# Patient Record
Sex: Female | Born: 1935 | Race: White | Hispanic: No | Smoking: Never smoker
Health system: Southern US, Community
[De-identification: ages and names within clinical notes are randomized; demographics above are authoritative.]

## PROBLEM LIST (undated history)

## (undated) DIAGNOSIS — C50919 Malignant neoplasm of unspecified site of unspecified female breast: Secondary | ICD-10-CM

## (undated) DIAGNOSIS — F0390 Unspecified dementia without behavioral disturbance: Secondary | ICD-10-CM

## (undated) DIAGNOSIS — J449 Chronic obstructive pulmonary disease, unspecified: Secondary | ICD-10-CM

## (undated) DIAGNOSIS — G20 Parkinson's disease: Secondary | ICD-10-CM

## (undated) DIAGNOSIS — I10 Essential (primary) hypertension: Secondary | ICD-10-CM

## (undated) DIAGNOSIS — G20A1 Parkinson's disease without dyskinesia, without mention of fluctuations: Secondary | ICD-10-CM

## (undated) DIAGNOSIS — I872 Venous insufficiency (chronic) (peripheral): Secondary | ICD-10-CM

## (undated) DIAGNOSIS — G4733 Obstructive sleep apnea (adult) (pediatric): Secondary | ICD-10-CM

## (undated) DIAGNOSIS — I509 Heart failure, unspecified: Secondary | ICD-10-CM

## (undated) HISTORY — PX: HIP FRACTURE SURGERY: SHX118

## (undated) HISTORY — PX: BREAST SURGERY: SHX581

---

## 2004-09-01 ENCOUNTER — Ambulatory Visit: Payer: Self-pay | Admitting: Oncology

## 2004-11-19 ENCOUNTER — Ambulatory Visit: Payer: Self-pay | Admitting: Oncology

## 2004-12-02 ENCOUNTER — Ambulatory Visit: Payer: Self-pay | Admitting: Oncology

## 2005-02-18 ENCOUNTER — Ambulatory Visit: Payer: Self-pay | Admitting: Oncology

## 2005-02-20 ENCOUNTER — Ambulatory Visit: Payer: Self-pay | Admitting: Oncology

## 2005-03-02 ENCOUNTER — Ambulatory Visit: Payer: Self-pay | Admitting: Oncology

## 2005-05-20 ENCOUNTER — Ambulatory Visit: Payer: Self-pay | Admitting: Oncology

## 2005-06-01 ENCOUNTER — Ambulatory Visit: Payer: Self-pay | Admitting: Oncology

## 2005-08-19 ENCOUNTER — Ambulatory Visit: Payer: Self-pay | Admitting: Oncology

## 2005-09-01 ENCOUNTER — Ambulatory Visit: Payer: Self-pay | Admitting: Oncology

## 2005-10-18 ENCOUNTER — Ambulatory Visit: Payer: Self-pay | Admitting: Oncology

## 2005-10-23 ENCOUNTER — Ambulatory Visit: Payer: Self-pay | Admitting: Oncology

## 2005-11-01 ENCOUNTER — Ambulatory Visit: Payer: Self-pay | Admitting: Oncology

## 2006-01-22 ENCOUNTER — Ambulatory Visit: Payer: Self-pay | Admitting: Oncology

## 2006-01-30 ENCOUNTER — Ambulatory Visit: Payer: Self-pay | Admitting: Oncology

## 2006-03-02 ENCOUNTER — Ambulatory Visit: Payer: Self-pay | Admitting: Oncology

## 2006-05-12 ENCOUNTER — Ambulatory Visit: Payer: Self-pay | Admitting: Oncology

## 2006-06-01 ENCOUNTER — Ambulatory Visit: Payer: Self-pay | Admitting: Oncology

## 2006-09-08 ENCOUNTER — Ambulatory Visit: Payer: Self-pay | Admitting: Oncology

## 2006-09-12 ENCOUNTER — Ambulatory Visit: Payer: Self-pay | Admitting: Oncology

## 2006-10-02 ENCOUNTER — Ambulatory Visit: Payer: Self-pay | Admitting: Oncology

## 2007-02-26 ENCOUNTER — Ambulatory Visit: Payer: Self-pay | Admitting: Oncology

## 2007-03-02 ENCOUNTER — Ambulatory Visit: Payer: Self-pay | Admitting: Oncology

## 2007-03-03 ENCOUNTER — Ambulatory Visit: Payer: Self-pay | Admitting: Oncology

## 2007-06-12 ENCOUNTER — Ambulatory Visit: Payer: Self-pay | Admitting: Oncology

## 2007-07-03 ENCOUNTER — Ambulatory Visit: Payer: Self-pay | Admitting: Internal Medicine

## 2007-07-03 ENCOUNTER — Ambulatory Visit: Payer: Self-pay | Admitting: Oncology

## 2007-08-03 ENCOUNTER — Ambulatory Visit: Payer: Self-pay | Admitting: Internal Medicine

## 2007-12-03 ENCOUNTER — Ambulatory Visit: Payer: Self-pay | Admitting: Oncology

## 2007-12-11 ENCOUNTER — Ambulatory Visit: Payer: Self-pay | Admitting: Oncology

## 2008-01-03 ENCOUNTER — Ambulatory Visit: Payer: Self-pay | Admitting: Oncology

## 2008-02-15 ENCOUNTER — Ambulatory Visit: Payer: Self-pay | Admitting: Oncology

## 2008-02-16 ENCOUNTER — Ambulatory Visit: Payer: Self-pay | Admitting: Oncology

## 2008-03-02 ENCOUNTER — Ambulatory Visit: Payer: Self-pay | Admitting: Oncology

## 2008-04-01 ENCOUNTER — Ambulatory Visit: Payer: Self-pay | Admitting: Oncology

## 2008-08-29 ENCOUNTER — Ambulatory Visit: Payer: Self-pay | Admitting: Oncology

## 2008-08-31 ENCOUNTER — Ambulatory Visit: Payer: Self-pay | Admitting: Oncology

## 2008-09-01 ENCOUNTER — Ambulatory Visit: Payer: Self-pay | Admitting: Oncology

## 2009-01-30 ENCOUNTER — Ambulatory Visit: Payer: Self-pay | Admitting: Oncology

## 2009-02-16 ENCOUNTER — Ambulatory Visit: Payer: Self-pay | Admitting: Oncology

## 2009-02-22 ENCOUNTER — Ambulatory Visit: Payer: Self-pay | Admitting: Oncology

## 2009-03-02 ENCOUNTER — Ambulatory Visit: Payer: Self-pay | Admitting: Oncology

## 2009-04-01 ENCOUNTER — Ambulatory Visit: Payer: Self-pay | Admitting: Oncology

## 2009-08-02 ENCOUNTER — Ambulatory Visit: Payer: Self-pay | Admitting: Oncology

## 2009-08-18 ENCOUNTER — Ambulatory Visit: Payer: Self-pay | Admitting: Oncology

## 2009-09-01 ENCOUNTER — Ambulatory Visit: Payer: Self-pay | Admitting: Oncology

## 2010-01-30 ENCOUNTER — Ambulatory Visit: Payer: Self-pay | Admitting: Oncology

## 2010-02-19 ENCOUNTER — Ambulatory Visit: Payer: Self-pay | Admitting: Oncology

## 2010-02-21 ENCOUNTER — Ambulatory Visit: Payer: Self-pay | Admitting: Oncology

## 2010-03-02 ENCOUNTER — Ambulatory Visit: Payer: Self-pay | Admitting: Oncology

## 2010-09-01 ENCOUNTER — Ambulatory Visit: Payer: Self-pay | Admitting: Oncology

## 2010-09-11 ENCOUNTER — Ambulatory Visit: Payer: Self-pay | Admitting: Oncology

## 2010-10-02 ENCOUNTER — Ambulatory Visit: Payer: Self-pay | Admitting: Oncology

## 2011-02-25 ENCOUNTER — Ambulatory Visit: Payer: Self-pay | Admitting: Oncology

## 2011-03-12 ENCOUNTER — Ambulatory Visit: Payer: Self-pay | Admitting: Oncology

## 2011-04-02 ENCOUNTER — Ambulatory Visit: Payer: Self-pay | Admitting: Oncology

## 2011-09-09 ENCOUNTER — Ambulatory Visit: Payer: Self-pay | Admitting: Oncology

## 2011-09-09 ENCOUNTER — Ambulatory Visit: Payer: Self-pay | Admitting: Physical Medicine and Rehabilitation

## 2011-09-10 LAB — CANCER ANTIGEN 27.29: CA 27.29: 30.2 U/mL (ref 0.0–38.6)

## 2011-10-03 ENCOUNTER — Ambulatory Visit: Payer: Self-pay | Admitting: Physical Medicine and Rehabilitation

## 2012-02-26 ENCOUNTER — Ambulatory Visit: Payer: Self-pay | Admitting: Oncology

## 2012-02-26 LAB — CBC CANCER CENTER
Eosinophil #: 0.1 x10 3/mm (ref 0.0–0.7)
Eosinophil %: 1.7 %
HGB: 14.4 g/dL (ref 12.0–16.0)
Lymphocyte #: 1.5 x10 3/mm (ref 1.0–3.6)
MCHC: 33.9 g/dL (ref 32.0–36.0)
MCV: 88 fL (ref 80–100)
Neutrophil #: 5 x10 3/mm (ref 1.4–6.5)
Neutrophil %: 70.4 %
Platelet: 168 x10 3/mm (ref 150–440)
RBC: 4.84 10*6/uL (ref 3.80–5.20)
RDW: 14 % (ref 11.5–14.5)

## 2012-02-26 LAB — COMPREHENSIVE METABOLIC PANEL
Anion Gap: 9 (ref 7–16)
BUN: 20 mg/dL — ABNORMAL HIGH (ref 7–18)
Bilirubin,Total: 0.3 mg/dL (ref 0.2–1.0)
Co2: 30 mmol/L (ref 21–32)
Creatinine: 1.1 mg/dL (ref 0.60–1.30)
EGFR (African American): >60
EGFR (Non-African Amer.): 52 — ABNORMAL LOW
Glucose: 138 mg/dL — ABNORMAL HIGH (ref 65–99)
Osmolality: 294 (ref 275–301)
SGOT(AST): 19 U/L (ref 15–37)
SGPT (ALT): 27 U/L
Sodium: 145 mmol/L (ref 136–145)

## 2012-03-02 ENCOUNTER — Ambulatory Visit: Payer: Self-pay | Admitting: Oncology

## 2012-08-31 ENCOUNTER — Ambulatory Visit: Payer: Self-pay | Admitting: Oncology

## 2012-08-31 LAB — CBC CANCER CENTER
Basophil #: 0 x10 3/mm (ref 0.0–0.1)
Basophil %: 0.8 %
Eosinophil #: 0.2 x10 3/mm (ref 0.0–0.7)
HGB: 14.8 g/dL (ref 12.0–16.0)
Lymphocyte %: 25.7 %
MCHC: 32.9 g/dL (ref 32.0–36.0)
Monocyte %: 8.5 %
Neutrophil #: 3.6 x10 3/mm (ref 1.4–6.5)
Neutrophil %: 61.8 %
Platelet: 169 x10 3/mm (ref 150–440)
RDW: 13.8 % (ref 11.5–14.5)
WBC: 5.8 x10 3/mm (ref 3.6–11.0)

## 2012-08-31 LAB — COMPREHENSIVE METABOLIC PANEL
Albumin: 3.9 g/dL (ref 3.4–5.0)
Alkaline Phosphatase: 93 U/L (ref 50–136)
Calcium, Total: 9.6 mg/dL (ref 8.5–10.1)
Chloride: 105 mmol/L (ref 98–107)
Co2: 28 mmol/L (ref 21–32)
EGFR (Non-African Amer.): >60
Potassium: 3.7 mmol/L (ref 3.5–5.1)
SGOT(AST): 19 U/L (ref 15–37)
SGPT (ALT): 27 U/L (ref 12–78)
Sodium: 145 mmol/L (ref 136–145)

## 2012-09-01 ENCOUNTER — Ambulatory Visit: Payer: Self-pay | Admitting: Oncology

## 2012-09-01 LAB — CANCER ANTIGEN 27.29: CA 27.29: 44.5 U/mL — ABNORMAL HIGH (ref 0.0–38.6)

## 2013-03-02 ENCOUNTER — Ambulatory Visit: Payer: Self-pay | Admitting: Oncology

## 2014-10-10 ENCOUNTER — Emergency Department: Payer: Self-pay | Admitting: Emergency Medicine

## 2014-10-10 LAB — COMPREHENSIVE METABOLIC PANEL
ALBUMIN: 3.8 g/dL (ref 3.4–5.0)
Alkaline Phosphatase: 99 U/L
Anion Gap: 6 — ABNORMAL LOW (ref 7–16)
BILIRUBIN TOTAL: 0.5 mg/dL (ref 0.2–1.0)
BUN: 14 mg/dL (ref 7–18)
CALCIUM: 9.6 mg/dL (ref 8.5–10.1)
Chloride: 104 mmol/L (ref 98–107)
Co2: 29 mmol/L (ref 21–32)
Creatinine: 1 mg/dL (ref 0.60–1.30)
GLUCOSE: 125 mg/dL — AB (ref 65–99)
Osmolality: 279 (ref 275–301)
Potassium: 4.2 mmol/L (ref 3.5–5.1)
SGOT(AST): 22 U/L (ref 15–37)
SGPT (ALT): 34 U/L
SODIUM: 139 mmol/L (ref 136–145)
Total Protein: 7.7 g/dL (ref 6.4–8.2)

## 2014-10-10 LAB — URINALYSIS, COMPLETE
Bilirubin,UR: NEGATIVE
Blood: NEGATIVE
Glucose,UR: NEGATIVE mg/dL (ref 0–75)
Ketone: NEGATIVE
NITRITE: NEGATIVE
Ph: 8 (ref 4.5–8.0)
Protein: NEGATIVE
RBC,UR: 16 /HPF (ref 0–5)
SPECIFIC GRAVITY: 1.015 (ref 1.003–1.030)
SQUAMOUS EPITHELIAL: NONE SEEN
WBC UR: 7 /HPF (ref 0–5)

## 2014-10-10 LAB — CBC
HCT: 43.8 % (ref 35.0–47.0)
HGB: 14.3 g/dL (ref 12.0–16.0)
MCH: 29.2 pg (ref 26.0–34.0)
MCHC: 32.7 g/dL (ref 32.0–36.0)
MCV: 89 fL (ref 80–100)
Platelet: 168 10*3/uL (ref 150–440)
RBC: 4.91 10*6/uL (ref 3.80–5.20)
RDW: 13.8 % (ref 11.5–14.5)
WBC: 14.8 10*3/uL — AB (ref 3.6–11.0)

## 2014-10-10 LAB — TROPONIN I: Troponin-I: <0.02 ng/mL

## 2014-10-22 ENCOUNTER — Emergency Department: Payer: Self-pay | Admitting: Emergency Medicine

## 2014-12-09 ENCOUNTER — Emergency Department: Payer: Self-pay | Admitting: Emergency Medicine

## 2015-03-21 ENCOUNTER — Inpatient Hospital Stay
Admit: 2015-03-21 | Disposition: A | Discharge status: Other Acute Inpatient Hospital | Payer: Self-pay | Attending: Internal Medicine | Admitting: Internal Medicine

## 2015-03-21 DIAGNOSIS — Z01818 Encounter for other preprocedural examination: Secondary | ICD-10-CM

## 2015-03-21 LAB — CBC WITH DIFFERENTIAL/PLATELET
BASOS ABS: 0 10*3/uL (ref 0.0–0.1)
BASOS PCT: 0.2 %
Basophil #: 0 10*3/uL (ref 0.0–0.1)
Basophil %: 0.3 %
EOS PCT: 0 %
Eosinophil #: 0 10*3/uL (ref 0.0–0.7)
Eosinophil #: 0 10*3/uL (ref 0.0–0.7)
Eosinophil %: 0.3 %
HCT: 43.5 % (ref 35.0–47.0)
HCT: 43.1 % (ref 35.0–47.0)
HGB: 14.3 g/dL (ref 12.0–16.0)
HGB: 14.1 g/dL (ref 12.0–16.0)
Lymphocyte #: 1.3 10*3/uL (ref 1.0–3.6)
Lymphocyte #: 1 10*3/uL (ref 1.0–3.6)
Lymphocyte %: 9.6 %
Lymphocyte %: 6.9 %
MCH: 28.8 pg (ref 26.0–34.0)
MCH: 28.4 pg (ref 26.0–34.0)
MCHC: 32.9 g/dL (ref 32.0–36.0)
MCHC: 32.6 g/dL (ref 32.0–36.0)
MCV: 88 fL (ref 80–100)
MCV: 87 fL (ref 80–100)
MONOS PCT: 5.2 %
MONOS PCT: 6.4 %
Monocyte #: 0.7 x10 3/mm (ref 0.2–0.9)
Monocyte #: 0.9 x10 3/mm (ref 0.2–0.9)
Neutrophil #: 11.1 10*3/uL — ABNORMAL HIGH (ref 1.4–6.5)
Neutrophil #: 12.5 10*3/uL — ABNORMAL HIGH (ref 1.4–6.5)
Neutrophil %: 84.6 %
Neutrophil %: 86.5 %
PLATELETS: 177 10*3/uL (ref 150–440)
Platelet: 167 10*3/uL (ref 150–440)
RBC: 4.96 10*6/uL (ref 3.80–5.20)
RBC: 4.95 10*6/uL (ref 3.80–5.20)
RDW: 14.1 % (ref 11.5–14.5)
RDW: 14 % (ref 11.5–14.5)
WBC: 13.1 10*3/uL — ABNORMAL HIGH (ref 3.6–11.0)
WBC: 14.4 10*3/uL — AB (ref 3.6–11.0)

## 2015-03-21 LAB — COMPREHENSIVE METABOLIC PANEL
ANION GAP: 7 (ref 7–16)
Albumin: 4.2 g/dL
Alkaline Phosphatase: 97 U/L
BILIRUBIN TOTAL: 0.5 mg/dL
BUN: 22 mg/dL — AB
CO2: 26 mmol/L
Calcium, Total: 10 mg/dL
Chloride: 107 mmol/L
Creatinine: 0.94 mg/dL
EGFR (African American): >60
GFR CALC NON AF AMER: 59 — AB
GLUCOSE: 146 mg/dL — AB
Potassium: 3.8 mmol/L
SGOT(AST): 40 U/L
SGPT (ALT): 34 U/L
Sodium: 140 mmol/L
Total Protein: 7.5 g/dL

## 2015-03-21 LAB — URINALYSIS, COMPLETE
BLOOD: NEGATIVE
Bacteria: NONE SEEN
Bilirubin,UR: NEGATIVE
GLUCOSE, UR: NEGATIVE mg/dL (ref 0–75)
Ketone: NEGATIVE
Nitrite: NEGATIVE
PH: 5 (ref 4.5–8.0)
Specific Gravity: 1.026 (ref 1.003–1.030)

## 2015-03-21 LAB — BASIC METABOLIC PANEL
Anion Gap: 8 (ref 7–16)
BUN: 21 mg/dL — AB
CHLORIDE: 105 mmol/L
Calcium, Total: 9.7 mg/dL
Co2: 27 mmol/L
Creatinine: 0.83 mg/dL
EGFR (African American): >60
GLUCOSE: 145 mg/dL — AB
Potassium: 3.7 mmol/L
SODIUM: 140 mmol/L

## 2015-03-21 LAB — TROPONIN I: Troponin-I: <0.03 ng/mL

## 2015-03-21 LAB — CK TOTAL AND CKMB (NOT AT ARMC)
CK, Total: 135 U/L
CK-MB: 4 ng/mL

## 2015-03-21 LAB — APTT: Activated PTT: 27.3 secs (ref 23.6–35.9)

## 2015-03-21 LAB — PROTIME-INR
INR: 1
PROTHROMBIN TIME: 13.6 s

## 2015-03-22 LAB — BASIC METABOLIC PANEL
Anion Gap: 7 (ref 7–16)
BUN: 13 mg/dL
Calcium, Total: 9.3 mg/dL
Chloride: 105 mmol/L
Co2: 29 mmol/L
Creatinine: 0.72 mg/dL
EGFR (Non-African Amer.): >60
GLUCOSE: 134 mg/dL — AB
Potassium: 3.6 mmol/L
Sodium: 141 mmol/L

## 2015-03-22 LAB — CBC WITH DIFFERENTIAL/PLATELET
BASOS ABS: 0 10*3/uL (ref 0.0–0.1)
Basophil %: 0.3 %
Eosinophil #: 0.1 10*3/uL (ref 0.0–0.7)
Eosinophil %: 1.1 %
HCT: 38.9 % (ref 35.0–47.0)
HGB: 13.1 g/dL (ref 12.0–16.0)
LYMPHS PCT: 15.3 %
Lymphocyte #: 1.3 10*3/uL (ref 1.0–3.6)
MCH: 29.3 pg (ref 26.0–34.0)
MCHC: 33.6 g/dL (ref 32.0–36.0)
MCV: 87 fL (ref 80–100)
Monocyte #: 0.7 x10 3/mm (ref 0.2–0.9)
Monocyte %: 8.6 %
NEUTROS PCT: 74.7 %
Neutrophil #: 6.2 10*3/uL (ref 1.4–6.5)
Platelet: 129 10*3/uL — ABNORMAL LOW (ref 150–440)
RBC: 4.46 10*6/uL (ref 3.80–5.20)
RDW: 13.9 % (ref 11.5–14.5)
WBC: 8.2 10*3/uL (ref 3.6–11.0)

## 2015-03-23 LAB — PLATELET COUNT: PLATELETS: 137 10*3/uL — AB (ref 150–440)

## 2015-03-24 LAB — BASIC METABOLIC PANEL
ANION GAP: 9 (ref 7–16)
BUN: 23 mg/dL — ABNORMAL HIGH
Calcium, Total: 9.5 mg/dL
Chloride: 106 mmol/L
Co2: 29 mmol/L
Creatinine: 0.65 mg/dL
EGFR (Non-African Amer.): >60
Glucose: 153 mg/dL — ABNORMAL HIGH
POTASSIUM: 3.4 mmol/L — AB
Sodium: 144 mmol/L

## 2015-03-24 LAB — CBC WITH DIFFERENTIAL/PLATELET
Basophil #: 0 10*3/uL (ref 0.0–0.1)
Basophil %: 0.5 %
Eosinophil #: 0.1 10*3/uL (ref 0.0–0.7)
Eosinophil %: 1.8 %
HCT: 40.1 % (ref 35.0–47.0)
HGB: 13.5 g/dL (ref 12.0–16.0)
Lymphocyte #: 1.4 10*3/uL (ref 1.0–3.6)
Lymphocyte %: 17.2 %
MCH: 29.2 pg (ref 26.0–34.0)
MCHC: 33.6 g/dL (ref 32.0–36.0)
MCV: 87 fL (ref 80–100)
MONOS PCT: 7.3 %
Monocyte #: 0.6 x10 3/mm (ref 0.2–0.9)
NEUTROS ABS: 6.1 10*3/uL (ref 1.4–6.5)
Neutrophil %: 73.2 %
Platelet: 154 10*3/uL (ref 150–440)
RBC: 4.6 10*6/uL (ref 3.80–5.20)
RDW: 14.5 % (ref 11.5–14.5)
WBC: 8.3 10*3/uL (ref 3.6–11.0)

## 2015-04-02 NOTE — Op Note (Signed)
PATIENT NAME:  Elizabeth Farmer, Elizabeth Farmer MR#:  [id number] DATE OF BIRTH:  [birth year]  DATE OF PROCEDURE:  03/21/2015  PREOPERATIVE DIAGNOSIS: Right intertrochanteric hip fracture.   POSTOPERATIVE DIAGNOSIS:  Right intertrochanteric hip fracture.   PROCEDURE:  Intramedullary fixation for intertrochanteric hip fracture.   ANESTHESIA: Spinal.   SURGEON: [place], M.D.   ESTIMATED BLOOD LOSS: 50 mL.   COMPLICATIONS: None.   IMPLANTS:  Biomet Affixus 11 x 180 mm short intramedullary rod, a 90 x 10 mm lag screw, and a 36 mm distal interlocking screw.   INDICATION FOR THE PROCEDURE: The patient is a 79 year old female who sustained a fall prior to her admission. She was brought to the [facility] Emergency Room where she was diagnosed with a minimally displaced intertrochanteric hip fracture of the right hip.  Given the patient is ambulatory at baseline, I have recommended surgical fixation for this fracture using an intramedullary rod. I reviewed the risks and benefits of surgery with the patient and her family who is at her bedside in the preoperative area prior to surgery. I marked the right hip with the word "yes" according to the hospital's right side protocol. I informed the patient and family of the risks and benefits of surgery which included infection, bleeding, nerve or blood vessel injury, the need for a blood transfusion, malunion, nonunion, leg length discrepancy, change in lower extremity rotation, persistent pain or weakness, and the need for further surgery including conversion to a total hip arthroplasty. Medical risks include, but are not limited to, DVT, pulmonary embolism, myocardial infarction, stroke, pneumonia, respiratory failure, and death. They understood these risks and wished to proceed with surgery. The patient was then cleared by the hospitalist service for surgery.   PROCEDURE NOTE:  The patient was brought to the operating room where she was placed supine on the  operative table after undergoing a spinal anesthetic by the anesthesia service. All bony prominences were adequately padded. The patient's right hip was placed in a leg holder with the hip in extension. The left leg was placed in a hemilithotomy position. The C-arm images in the AP and lateral planes were taken. A closed reduction of the fracture was performed. Once adequate reduction was achieved, the patient was prepped and draped in a sterile fashion. Timeout was performed to verify the patient's name, date of birth, medical record number, correct site of surgery, and correct procedure to be performed. It was also used to verify that patient received antibiotics and appropriate instruments, implants, and radiographic studies were available in the room. Once all in attendance were in agreement, the case began.   The patient had C-arm images taken in AP and lateral planes to allow for incisional planning. The first incision was made superior to the tip of the greater trochanter in line with the femur. The deep fascia was then incised literally with a deep #10 blade.  The tip of the greater trochanter was then palpated after the abductor muscle was split in line with its fibers. This allowed for placement of a drill pin at the tip of the greater trochanter. This was advanced into the proximal femur to the level of the lesser trochanter. The position of the drill pin was confirmed on AP and lateral C-arm imaging. This was then overdrilled using a cannulated drill. A soft tissue protector was used to protect the abductor muscle and tendon. An 11 x 180 mm Biomet Affixus short nail was then inserted into the femur through this hole in the  tip of the greater trochanter. This was then manually placed into position. The position of the intramedullary rod was confirmed on AP and lateral C-arm images. Through the Biomet Affixus guide arm, a drill guide was then placed for the drill pin of the lag screw. A small stab  incision was made to allow for placement of this drill guide along the lateral cortex of the femur.  The drill pin was then advanced across the fracture site and into the femoral head. Its position was confirmed on the AP and lateral C-arm images. A depth gauge was used to measure the depth which was determined to be 95 mm in length. The drill pin was then overdrilled with the cannulated drill for the lag screw. A 95 mm lag screw was then advanced into position by hand into the subcortical bone of the femoral head.  A tip to apex distance of less than 25 mm was achieved. The compression was applied across the fracture site as the traction was relieved. The set screw was then tightened through the top of the Biomet Affixus nail. The guide sleeve for the lag screw was then removed from the Affixus guide arm. The distal interlocking guide screw was then placed through the guide arm and another small stab incision was made along the lateral femur to allow for placement of the drill guide along the lateral cortex of the femur. The drill for the distal interlocking screw was then advanced bicortically. The interlocking screw was then measured at 36 mm.  A 36 mm distal interlocking screw was then advanced into position by hand. The guide arm for the Affixus nail was then removed. Final C-arm images of the construct were taken. The wounds were copiously irrigated. The deep fascia in the proximal incision was closed with an interrupted #1 Vicryl and the subcutaneous tissues of all 3 incisions were closed with 2-0 Vicryl. Staples were used to approximate the skin along with a dry sterile dressing. The patient was then transferred to a hospital bed and brought to the PACU in stable condition and I spoke with her family in the postop consultation room to let them know the case had gone without complication. The patient was stable in the recovery room.   ____________________________ Timoteo Gaul,  MD klk:sp D: 03/21/2015 15:39:23 ET T: 03/21/2015 15:52:00 ET JOB#: [id number]  cc: Timoteo Gaul, MD, <Dictator> Timoteo Gaul MD ELECTRONICALLY SIGNED 03/29/2015 13:22

## 2015-04-02 NOTE — H&P (Signed)
PATIENT NAME:  Elizabeth Farmer, Elizabeth Farmer MR#:  [id number] DATE OF BIRTH:  Jun 07, 1936  DATE OF ADMISSION:  03/21/2015  CHIEF COMPLAINT:  Fall.   HISTORY OF PRESENT ILLNESS:  This is a 79 year old female who was in a skilled nursing facility. She has Alzheimer's, COPD, CHF, hypertension, history of breast cancer. She was walking with her walker and stated that her legs felt weak and she had a mechanical fall with a subsequent right hip fracture. She was brought to the ED for evaluation. Orthopedics has seen her and requested that she be admitted to the hospitalist service for possible intervention on her right hip fracture in the morning, so the hospitalists were called for admission.   Due to the patient's Alzheimer dementia, much of the history is given per her sister, who is present in the room.   PRIMARY CARE PHYSICIAN:  Constance Goltz, MD   PAST MEDICAL HISTORY:  Hypertension, COPD, CHF, Alzheimer's, breast cancer as well as a prior right arm fracture in November 2015.   CURRENT MEDICATIONS:  Acetaminophen 500 mg q. 6 hours p.r.n., Advair 250/50 Diskus 1 puff by mouth b.i.d., calcium carbonate 640 mg 1 tab p.o. every morning, diphenhydramine 25 mg 1 tab p.o. daily as needed for food allergy, Lasix 20 mg daily, letrozole 2.5 mg daily, losartan 100 mg daily, metoprolol succinate extended-release 50 mg daily, potassium chloride 10 mEq 2 caps twice a day, Senna-Lax 8.6 mg 1 tablet by mouth at bedtime, Spiriva 18 mcg capsule 1 capsule daily inhalation, vitamin D3 at 1000 units daily, Ventolin 90 mcg inhaler 2 puffs by mouth q. 4 hours p.r.n. for wheezing.   PAST SURGICAL HISTORY:  Bladder sling and 2 breast surgeries for her breast cancer.   ALLERGIES:  ASPIRIN, SULFA, YELLOW DYE #5, FISH, SALICYLATES.   FAMILY HISTORY:  CAD, diabetes mellitus, cancer.   SOCIAL HISTORY:  Never a smoker. Denies alcohol or illicit drug use. She was exposed to secondhand smoke for many years from her husband.   REVIEW OF  SYSTEMS: CONSTITUTIONAL:  Denies fever, fatigue. Endorses some mild generalized weakness, which may have led to the mechanical fall.  EYES:  No blurred or double vision, pain, or redness.  EAR, NOSE, AND THROAT:  No ear pain, hearing loss, or difficulty swallowing.  RESPIRATORY:  No cough, dyspnea, or painful respiration.  CARDIOVASCULAR:  No chest pain, edema, or palpitations.  GASTROINTESTINAL:  No nausea, vomiting, diarrhea, abdominal pain, or constipation.  GENITOURINARY:  No dysuria, hematuria, or frequency.  ENDOCRINE:  No nocturia, thyroid problems, or heat or cold intolerance.  HEMATOLOGIC AND LYMPHATIC:  No easy bruising or bleeding or swollen glands.  INTEGUMENTARY:  No acne, rash, or lesion.  MUSCULOSKELETAL:  No acute arthritis, joint swelling, or gout.  NEUROLOGICAL:  No numbness. No focal weakness. No headache.  PSYCHIATRIC:  No anxiety, insomnia, or depression.   PHYSICAL EXAMINATION: VITAL SIGNS:  Blood pressure 171/101, pulse 95, temperature 97.6, respirations 20 with 95% O2 saturations on room air.  GENERAL:  This is an elderly female supine in bed in no acute distress.  HEENT:  Pupils are equal, round, and reactive to light. Extraocular movements are intact. No scleral icterus. Moist mucosal membranes. She does have a little bit of left-sided facial swelling, where she may have hit her head as she fell as well, but no acute bruising and no fracture, hematoma, or hemorrhage anywhere. NECK:  Thyroid is not enlarged. Neck is supple. No masses. Nontender. No cervical adenopathy. No JVD.  RESPIRATORY:  Clear to auscultation bilaterally with no rales, rhonchi, or wheezing. No respiratory distress.  CARDIOVASCULAR:  Regular rate and rhythm. No murmurs, rubs, or gallops on exam. Good pedal pulses with no lower extremity edema.  ABDOMEN:  Soft, nontender, nondistended. Good bowel sounds.  MUSCULOSKELETAL:  She has 5/5 muscular strength in her upper extremities and her left lower  extremity. She is unable to move her right lower extremity without significant pain. She has full spontaneous range of motion throughout her 3 nonaffected extremities. No cyanosis or clubbing.  SKIN:  No rash or lesions. Skin is warm, dry, and intact.  LYMPHATIC:  No adenopathy.  NEUROLOGICAL:  Cranial nerves are intact. Sensation is intact throughout. No dysarthria or aphasia.  PSYCHIATRIC:  She is alert. She is oriented to person, but not to place, time, or circumstance. She is cooperative, confused, and not agitated.   LABORATORY DATA:  White count is 13.1, hemoglobin 14.3, hematocrit 43.5, and platelets are 177,000. Sodium is 140, potassium 3.8, chloride 107, bicarbonate 26, BUN 22, creatinine 0.94, calcium 10, glucose 146, total protein 7.5, albumin 4.2, total bilirubin 0.5, alkaline phosphatase 97, AST 40, and ALT is 34. Troponin is less than 0.03. UA is largely negative. She has trace leukocyte esterase and 6 to 3 RBCs with 0 to 5 WBCs, no bacteria seen, a largely negative UA.   RADIOLOGIC DATA:  Radiographic imaging results are not up yet, but preliminary x-ray showed the right hip fracture.   ASSESSMENT AND PLAN: 1.  Right hip fracture. This is after a mechanical fall. Orthopedics is aware of this patient and is being consulted. Hospitalists are admitting with orthopedics consult for potential repair of the fracture. The patient will be given good pain control and deep vein thrombosis prophylaxis as below.  2.  Cardiac risk stratification. There is a specific request by Orthopedics for cardiac risk stratification in this patient. According to the revised [facility] Cardiac Risk index, this is not one of the high-risk types of surgery. The patient only has one risk factor, which includes congestive heart failure. She has no history of cerebrovascular disease. No history of diabetes mellitus requiring insulin. Her creatinine today was less than 2. She has no history of ischemic heart disease. With  one risk factor, the risk of cardiac death, nonfatal myocardial infarction, and nonfatal cardiac arrest is 1% as related to the surgery. The risk of myocardial infarction, pulmonary edema, ventricular fibrillation, primary cardiac arrest, and complete heart block as related to surgery with one risk factor is 1.3%.  3.  Hypertension. This is a chronic problem. We will continue her home medications and add on some IV p.r.n. medications to keep her blood pressure controlled.  4.  Chronic obstructive pulmonary disease, a chronic stable problem. We will continue her home medications for this, specifically her home inhalers.  5.  Breast cancer. She is still being treated with letrozole for this. We will continue this medication while here.  6.  Deep vein thrombosis prophylaxis with subcutaneous heparin.   CODE STATUS:  As stated above, this patient is DO NOT RESUSCITATE.   TIME SPENT ON THIS ADMISSION:  50 minutes.   ____________________________ Wilford Corner. Jannifer Franklin, MD dfw:nb D: 03/21/2015 03:17:05 ET T: 03/21/2015 03:45:11 ET JOB#: [id number]  cc: Wilford Corner. Jannifer Franklin, MD, <Dictator> Satoria Dunlop Fawn Kirk MD ELECTRONICALLY SIGNED 03/21/2015 5:33

## 2015-04-02 NOTE — Discharge Summary (Signed)
PATIENT NAME:  Elizabeth Farmer, Elizabeth Farmer MR#:  [id number] DATE OF BIRTH:  01/02/1936  DATE OF ADMISSION:  03/21/2015 DATE OF DISCHARGE:  03/24/2015  PRIMARY CARE PHYSICIAN: Dr. Constance Goltz   FINAL DIAGNOSES: 1.  Right hip fracture, closed, requiring operative repair.  2.  Hypertension, essential.  3.  Alzheimer dementia without behavioral disturbance.  4.  Chronic obstructive pulmonary disease.  5.  History of breast cancer.   MEDICATIONS ON DISCHARGE: Include Advair Diskus 250/50 one puff twice a day, calcium carbonate 648 one tablet once a day in the morning, furosemide 20 mg in the morning, letrozole 2.5 mg at bedtime, losartan 100 mg in the morning, metoprolol extended release 50 mg at bedtime, potassium chloride 10 mEq 2 tablets twice a day, Spiriva 18 mcg 1 capsule daily, Ventolin HFA 2 puffs every 4 hours as needed for wheezing, shortness breath, acetaminophen 500 mg every 6 hours as needed for fever, Benadryl 25 mg once a day in the morning, senna lax 8.6 mg 1 tablet at bedtime, oxycodone 5 mg every 6 hours as needed for moderate pain, heparin 500 units subcutaneous injection every 12 hours for 14 days, ferrous sulfate 325 mg 1 tablet twice a day, calcium and vitamin D 500 mg/200 international units 1 tablet twice a day, Colace 100 mg twice a day as needed for stool softener.   DRESSING CARE: As per Dr. Mack Guise.   DIET: Low-sodium, regular consistency.   ACTIVITY: As tolerated.   FOLLOWUP: With physical therapy, Dr. Mack Guise orthopedics, 1 to 2 days with doctor at rehabilitation.   HOSPITAL COURSE: The patient was admitted April 19, discharged 03/24/2015. Came in with a fall, found to have a right hip fracture.   LABORATORY AND RADIOLOGICAL DATA DURING THE HOSPITAL COURSE: Included a troponin that was negative. Glucose 146, BUN 22, creatinine 0.94, sodium 140, potassium 3.8, chloride 107, CO2 of 26, calcium 10.0. Liver function tests normal range. White blood cell count 13.1, hemoglobin  and hematocrit 14.3 and 43.5, platelet count of 177,000. CT scan of the head: No acute intracranial process. Atrophy with chronic microvascular ischemic disease. Right hip fracture, showed intertrochanteric right femur fracture. Chest x-ray, no definite acute process identified. Urinalysis, trace leukocyte esterase, otherwise negative. PT, INR, and PTT normal range. EKG: Normal sinus rhythm, Q waves inferiorly. Right hip postoperative showed hardware fixation of the previously demonstrated nondisplaced right intertrochanteric fracture with normal alignment. Upon discharge, white count 8.3, hemoglobin 13.5, platelet count 154, glucose 153, BUN 23, creatinine 0.65, potassium 3.4.   HOSPITAL COURSE PER PROBLEM LIST:  1. For the patient's right hip fracture, operative repair as per Dr. Mack Guise. Please see operative note. The patient in the hospital 3 days postoperatively. Will go out to rehabilitation for further gait training.  2. Hypertension, essential. Very variable during the hospital course. Continue usual medications. I think pain and dementia may have something to do with it.  3. Alzheimer dementia without behavioral disturbance.  4. History of breast cancer, on letrozole.  5. Chronic obstructive pulmonary disease, respiratory status stable.   TIME SPENT ON DISCHARGE: 35 minutes    ____________________________ Tana Conch. Leslye Peer, MD rjw:at D: 03/24/2015 09:08:12 ET T: 03/24/2015 09:16:55 ET JOB#: [id number]  cc: Tana Conch. Leslye Peer, MD, <Dictator> Dr. Constance Goltz Marisue Brooklyn MD ELECTRONICALLY SIGNED 03/24/2015 15:43

## 2015-04-13 DIAGNOSIS — I10 Essential (primary) hypertension: Secondary | ICD-10-CM

## 2015-04-13 DIAGNOSIS — G301 Alzheimer's disease with late onset: Secondary | ICD-10-CM

## 2015-04-13 DIAGNOSIS — S7292XS Unspecified fracture of left femur, sequela: Secondary | ICD-10-CM

## 2015-04-13 DIAGNOSIS — C50919 Malignant neoplasm of unspecified site of unspecified female breast: Secondary | ICD-10-CM

## 2015-04-13 DIAGNOSIS — J449 Chronic obstructive pulmonary disease, unspecified: Secondary | ICD-10-CM

## 2015-04-13 DIAGNOSIS — I872 Venous insufficiency (chronic) (peripheral): Secondary | ICD-10-CM

## 2015-05-08 ENCOUNTER — Telehealth: Payer: Self-pay

## 2015-05-08 NOTE — Telephone Encounter (Signed)
PLEASE NOTE: All timestamps contained within this report are represented as [place] Standard Time. CONFIDENTIALTY NOTICE: This fax transmission is intended only for the addressee. It contains information that is legally privileged, confidential or otherwise protected from use or disclosure. If you are not the intended recipient, you are strictly prohibited from reviewing, disclosing, copying using or disseminating any of this information or taking any action in reliance on or regarding this information. If you have received this fax in error, please notify us immediately by telephone so that we can arrange for its return to [place]. Phone: [phone], Toll-Free: [phone], Fax: [phone] Page: 1 of 2 Call Id: [id number] [place] Patient Name: Elizabeth Farmer Gender: Female DOB: 07/31/1936 Age: 79 Y 2 M 3 D Return Phone Number: Address: City/State/Zip:  Client [place] Night - Client Client Site [facility] Physician Viviana Simpler Contact Type Call Call Type Triage / Clinical Caller Name Becky RN with [place] Relationship To Patient Other Return Phone Number Please choose phone number Chief Complaint Paging or Request for Consult Initial Comment Caller states she needs an order to hold aspirin until Monday. CB# [phone] Nurse Assessment Nurse: Justine Null, RN, Rodena Piety Date/Time Eilene Ghazi Time): 05/06/2015 1:27:23 PM Confirm and document reason for call. If symptomatic, describe symptoms. ---Caller states she needs an order to hold aspirin until Monday. CB# [phone] caller stated that she has an allergy to salicyates and aspirin and has been getting it daily for about a month and has ASA 325 mg enteric coated and was to start after the heparin was discontinued Has the patient traveled out of the country within the last  30 days? ---No Does the patient require triage? ---Declined Triage Please document clinical information provided and list any resource used. ---when triager reached the caller she stated that she has found an ASAallergy on the chart and they have been giving for the past month daily Guidelines Guideline Title Affirmed Question Affirmed Notes Nurse Date/Time ([place] Time) Disp. Time Eilene Ghazi Time) Disposition Final User 05/06/2015 1:31:44 PM Paged On Call back to [facility], RN, Rodena Piety 05/06/2015 1:38:11 PM Call Completed Justine Null, RN, Rodena Piety 05/06/2015 1:38:26 PM Clinical Call Yes Justine Null, RN, Rodena Piety After Care Instructions Given Call Event Type User Date / Time Description Comments User: Charmayne Sheer, RN Date/Time Eilene Ghazi Time): 05/06/2015 1:45:46 PM PLEASE NOTE: All timestamps contained within this report are represented as [place] Standard Time. CONFIDENTIALTY NOTICE: This fax transmission is intended only for the addressee. It contains information that is legally privileged, confidential or otherwise protected from use or disclosure. If you are not the intended recipient, you are strictly prohibited from reviewing, disclosing, copying using or disseminating any of this information or taking any action in reliance on or regarding this information. If you have received this fax in error, please notify us immediately by telephone so that we can arrange for its return to [place]. Phone: [phone], Toll-Free: [phone], Fax: [phone] Page: 2 of 2 Call Id: [id number] Comments call placed to the caller to notify her of the need to place the ASA on hold until Monday and to call the office on Monday to followup with the MD to see what he wants to do as her anticoagulation Verbalized understanding Paging DoctorName Phone DateTime Result/Outcome Message Type Notes Alysia Penna [phone] 05/06/2015 1:31:44 PM Paged On Call Back to Call Center Doctor Paged please call Rodena Piety at [facility]  at [phone] Thank you Alysia Penna 05/06/2015 1:37:52 PM Spoke with On Call - General Message Result recieved a return call form the on call MD and informed him of the patient status and the request to be able to hold the ASA until monday orders given to hold until MOnday but on MOnday to call the office to foolow up to see what her mD would like to use

## 2015-05-08 NOTE — Telephone Encounter (Signed)
I dealt with this at [facility]

## 2015-05-17 DIAGNOSIS — I872 Venous insufficiency (chronic) (peripheral): Secondary | ICD-10-CM | POA: Diagnosis not present

## 2015-05-17 DIAGNOSIS — J449 Chronic obstructive pulmonary disease, unspecified: Secondary | ICD-10-CM

## 2015-05-17 DIAGNOSIS — G309 Alzheimer's disease, unspecified: Secondary | ICD-10-CM

## 2015-05-17 DIAGNOSIS — I10 Essential (primary) hypertension: Secondary | ICD-10-CM | POA: Diagnosis not present

## 2015-06-06 DIAGNOSIS — G301 Alzheimer's disease with late onset: Secondary | ICD-10-CM

## 2015-06-06 DIAGNOSIS — I10 Essential (primary) hypertension: Secondary | ICD-10-CM

## 2015-06-06 DIAGNOSIS — J449 Chronic obstructive pulmonary disease, unspecified: Secondary | ICD-10-CM

## 2015-06-06 DIAGNOSIS — C50919 Malignant neoplasm of unspecified site of unspecified female breast: Secondary | ICD-10-CM

## 2015-06-06 DIAGNOSIS — J9611 Chronic respiratory failure with hypoxia: Secondary | ICD-10-CM

## 2015-08-13 ENCOUNTER — Emergency Department: Payer: Medicare Other

## 2015-08-13 ENCOUNTER — Encounter: Payer: Self-pay | Admitting: Emergency Medicine

## 2015-08-13 ENCOUNTER — Inpatient Hospital Stay
Admission: EM | Admit: 2015-08-13 | Discharge: 2015-08-15 | DRG: 177 | Disposition: A | Discharge status: Skilled Nursing Facility | Payer: Medicare Other | Attending: Internal Medicine | Admitting: Internal Medicine

## 2015-08-13 DIAGNOSIS — Z7401 Bed confinement status: Secondary | ICD-10-CM

## 2015-08-13 DIAGNOSIS — J9600 Acute respiratory failure, unspecified whether with hypoxia or hypercapnia: Secondary | ICD-10-CM | POA: Diagnosis present

## 2015-08-13 DIAGNOSIS — J9690 Respiratory failure, unspecified, unspecified whether with hypoxia or hypercapnia: Secondary | ICD-10-CM

## 2015-08-13 DIAGNOSIS — J690 Pneumonitis due to inhalation of food and vomit: Principal | ICD-10-CM | POA: Diagnosis present

## 2015-08-13 DIAGNOSIS — Z888 Allergy status to other drugs, medicaments and biological substances status: Secondary | ICD-10-CM

## 2015-08-13 DIAGNOSIS — Z66 Do not resuscitate: Secondary | ICD-10-CM | POA: Diagnosis present

## 2015-08-13 DIAGNOSIS — Z7951 Long term (current) use of inhaled steroids: Secondary | ICD-10-CM | POA: Diagnosis not present

## 2015-08-13 DIAGNOSIS — Z9889 Other specified postprocedural states: Secondary | ICD-10-CM

## 2015-08-13 DIAGNOSIS — I10 Essential (primary) hypertension: Secondary | ICD-10-CM | POA: Diagnosis present

## 2015-08-13 DIAGNOSIS — J189 Pneumonia, unspecified organism: Secondary | ICD-10-CM

## 2015-08-13 DIAGNOSIS — G4733 Obstructive sleep apnea (adult) (pediatric): Secondary | ICD-10-CM | POA: Diagnosis present

## 2015-08-13 DIAGNOSIS — I509 Heart failure, unspecified: Secondary | ICD-10-CM | POA: Diagnosis present

## 2015-08-13 DIAGNOSIS — Z79899 Other long term (current) drug therapy: Secondary | ICD-10-CM | POA: Diagnosis not present

## 2015-08-13 DIAGNOSIS — J441 Chronic obstructive pulmonary disease with (acute) exacerbation: Secondary | ICD-10-CM | POA: Diagnosis present

## 2015-08-13 DIAGNOSIS — F0390 Unspecified dementia without behavioral disturbance: Secondary | ICD-10-CM | POA: Diagnosis present

## 2015-08-13 DIAGNOSIS — E119 Type 2 diabetes mellitus without complications: Secondary | ICD-10-CM | POA: Diagnosis present

## 2015-08-13 DIAGNOSIS — Z853 Personal history of malignant neoplasm of breast: Secondary | ICD-10-CM

## 2015-08-13 DIAGNOSIS — Z993 Dependence on wheelchair: Secondary | ICD-10-CM | POA: Diagnosis not present

## 2015-08-13 DIAGNOSIS — G20 Parkinson's disease: Secondary | ICD-10-CM | POA: Diagnosis present

## 2015-08-13 DIAGNOSIS — J9601 Acute respiratory failure with hypoxia: Secondary | ICD-10-CM | POA: Diagnosis present

## 2015-08-13 DIAGNOSIS — Z91041 Radiographic dye allergy status: Secondary | ICD-10-CM | POA: Diagnosis not present

## 2015-08-13 DIAGNOSIS — R401 Stupor: Secondary | ICD-10-CM | POA: Diagnosis not present

## 2015-08-13 DIAGNOSIS — R000 Tachycardia, unspecified: Secondary | ICD-10-CM | POA: Diagnosis not present

## 2015-08-13 DIAGNOSIS — Z8249 Family history of ischemic heart disease and other diseases of the circulatory system: Secondary | ICD-10-CM

## 2015-08-13 HISTORY — DX: Heart failure, unspecified: I50.9

## 2015-08-13 HISTORY — DX: Parkinson's disease: G20

## 2015-08-13 HISTORY — DX: Parkinson's disease without dyskinesia, without mention of fluctuations: G20.A1

## 2015-08-13 HISTORY — DX: Malignant neoplasm of unspecified site of unspecified female breast: C50.919

## 2015-08-13 HISTORY — DX: Chronic obstructive pulmonary disease, unspecified: J44.9

## 2015-08-13 HISTORY — DX: Unspecified dementia, unspecified severity, without behavioral disturbance, psychotic disturbance, mood disturbance, and anxiety: F03.90

## 2015-08-13 HISTORY — DX: Essential (primary) hypertension: I10

## 2015-08-13 LAB — COMPREHENSIVE METABOLIC PANEL
ALK PHOS: 101 U/L (ref 38–126)
ALT: 27 U/L (ref 14–54)
AST: 44 U/L — AB (ref 15–41)
Albumin: 4.7 g/dL (ref 3.5–5.0)
Anion gap: 12 (ref 5–15)
BUN: 17 mg/dL (ref 6–20)
CALCIUM: 10.3 mg/dL (ref 8.9–10.3)
CHLORIDE: 100 mmol/L — AB (ref 101–111)
CO2: 26 mmol/L (ref 22–32)
CREATININE: 1.11 mg/dL — AB (ref 0.44–1.00)
GFR calc Af Amer: 54 mL/min — ABNORMAL LOW (ref >60)
GFR calc non Af Amer: 47 mL/min — ABNORMAL LOW (ref >60)
Glucose, Bld: 159 mg/dL — ABNORMAL HIGH (ref 65–99)
Potassium: 4.1 mmol/L (ref 3.5–5.1)
SODIUM: 138 mmol/L (ref 135–145)
Total Bilirubin: 1.2 mg/dL (ref 0.3–1.2)
Total Protein: 8.5 g/dL — ABNORMAL HIGH (ref 6.5–8.1)

## 2015-08-13 LAB — CBC WITH DIFFERENTIAL/PLATELET
BASOS ABS: 0 10*3/uL (ref 0–0.1)
Basophils Relative: 0 %
EOS ABS: 0 10*3/uL (ref 0–0.7)
EOS PCT: 0 %
HCT: 46.9 % (ref 35.0–47.0)
HEMOGLOBIN: 15.3 g/dL (ref 12.0–16.0)
LYMPHS ABS: 0.4 10*3/uL — AB (ref 1.0–3.6)
LYMPHS PCT: 2 %
MCH: 28.2 pg (ref 26.0–34.0)
MCHC: 32.6 g/dL (ref 32.0–36.0)
MCV: 86.3 fL (ref 80.0–100.0)
Monocytes Absolute: 0.6 10*3/uL (ref 0.2–0.9)
Monocytes Relative: 3 %
NEUTROS PCT: 95 %
Neutro Abs: 16.2 10*3/uL — ABNORMAL HIGH (ref 1.4–6.5)
PLATELETS: 167 10*3/uL (ref 150–440)
RBC: 5.44 MIL/uL — AB (ref 3.80–5.20)
RDW: 13.8 % (ref 11.5–14.5)
WBC: 17.2 10*3/uL — AB (ref 3.6–11.0)

## 2015-08-13 LAB — TROPONIN I: Troponin I: <0.03 ng/mL (ref <0.031)

## 2015-08-13 LAB — BRAIN NATRIURETIC PEPTIDE: B Natriuretic Peptide: 42 pg/mL (ref 0.0–100.0)

## 2015-08-13 LAB — MRSA PCR SCREENING: MRSA by PCR: NEGATIVE

## 2015-08-13 MED ORDER — UMECLIDINIUM BROMIDE 62.5 MCG/INH IN AEPB: 1.0000 | INHALATION_SPRAY | Freq: Every day | RESPIRATORY_TRACT | Status: DC

## 2015-08-13 MED ORDER — METHYLPREDNISOLONE SODIUM SUCC 125 MG IJ SOLR
125.0000 mg | Freq: Once | INTRAMUSCULAR | Status: AC
  Administered 2015-08-13: 125 mg via INTRAVENOUS
  Filled 2015-08-13: qty 2

## 2015-08-13 MED ORDER — ONDANSETRON HCL 4 MG/2ML IJ SOLN: 4.0000 mg | Freq: Four times a day (QID) | INTRAMUSCULAR | Status: DC | PRN

## 2015-08-13 MED ORDER — FUROSEMIDE 20 MG PO TABS
20.0000 mg | ORAL_TABLET | ORAL | Status: DC
  Administered 2015-08-14: 20 mg via ORAL
  Filled 2015-08-13 (×2): qty 1

## 2015-08-13 MED ORDER — LEVALBUTEROL HCL 1.25 MG/0.5ML IN NEBU
1.2500 mg | INHALATION_SOLUTION | Freq: Four times a day (QID) | RESPIRATORY_TRACT | Status: DC
  Administered 2015-08-14: 1.25 mg via RESPIRATORY_TRACT
  Filled 2015-08-13: qty 0.5

## 2015-08-13 MED ORDER — LEVOFLOXACIN IN D5W 500 MG/100ML IV SOLN
500.0000 mg | INTRAVENOUS | Status: DC
  Administered 2015-08-13: 500 mg via INTRAVENOUS
  Filled 2015-08-13: qty 100

## 2015-08-13 MED ORDER — LEVALBUTEROL HCL 1.25 MG/0.5ML IN NEBU: 1.2500 mg | INHALATION_SOLUTION | Freq: Four times a day (QID) | RESPIRATORY_TRACT | Status: DC

## 2015-08-13 MED ORDER — ACETAMINOPHEN 650 MG RE SUPP: 650.0000 mg | Freq: Four times a day (QID) | RECTAL | Status: DC | PRN

## 2015-08-13 MED ORDER — LEVALBUTEROL HCL 1.25 MG/0.5ML IN NEBU
1.2500 mg | INHALATION_SOLUTION | Freq: Four times a day (QID) | RESPIRATORY_TRACT | Status: DC
  Filled 2015-08-13: qty 0.5

## 2015-08-13 MED ORDER — VITAMIN D (ERGOCALCIFEROL) 1.25 MG (50000 UNIT) PO CAPS: 50000.0000 [IU] | ORAL_CAPSULE | ORAL | Status: DC

## 2015-08-13 MED ORDER — MOMETASONE FURO-FORMOTEROL FUM 100-5 MCG/ACT IN AERO
2.0000 | INHALATION_SPRAY | Freq: Two times a day (BID) | RESPIRATORY_TRACT | Status: DC
  Administered 2015-08-14: 2 via RESPIRATORY_TRACT
  Filled 2015-08-13: qty 8.8

## 2015-08-13 MED ORDER — HALOPERIDOL LACTATE 5 MG/ML IJ SOLN
5.0000 mg | Freq: Once | INTRAMUSCULAR | Status: AC
  Administered 2015-08-14: 5 mg via INTRAVENOUS
  Filled 2015-08-13: qty 1

## 2015-08-13 MED ORDER — SODIUM CHLORIDE 0.9 % IJ SOLN
3.0000 mL | Freq: Two times a day (BID) | INTRAMUSCULAR | Status: DC
  Administered 2015-08-13 – 2015-08-14 (×2): 3 mL via INTRAVENOUS

## 2015-08-13 MED ORDER — ALBUTEROL SULFATE (2.5 MG/3ML) 0.083% IN NEBU
2.5000 mg | INHALATION_SOLUTION | Freq: Once | RESPIRATORY_TRACT | Status: AC
  Administered 2015-08-13: 2.5 mg via RESPIRATORY_TRACT
  Filled 2015-08-13: qty 3

## 2015-08-13 MED ORDER — DEXTROSE 5 % IV SOLN: 500.0000 mg | Freq: Once | INTRAVENOUS | Status: DC

## 2015-08-13 MED ORDER — ENOXAPARIN SODIUM 40 MG/0.4ML ~~LOC~~ SOLN
40.0000 mg | SUBCUTANEOUS | Status: DC
  Administered 2015-08-13 – 2015-08-14 (×2): 40 mg via SUBCUTANEOUS
  Filled 2015-08-13 (×2): qty 0.4

## 2015-08-13 MED ORDER — SODIUM CHLORIDE 0.9 % IV SOLN
250.0000 mL | INTRAVENOUS | Status: DC | PRN
  Administered 2015-08-14: 250 mL via INTRAVENOUS

## 2015-08-13 MED ORDER — SODIUM CHLORIDE 0.9 % IV BOLUS (SEPSIS)
500.0000 mL | Freq: Once | INTRAVENOUS | Status: AC
  Administered 2015-08-13: 500 mL via INTRAVENOUS

## 2015-08-13 MED ORDER — METHYLPREDNISOLONE SODIUM SUCC 125 MG IJ SOLR
60.0000 mg | Freq: Four times a day (QID) | INTRAMUSCULAR | Status: DC
  Administered 2015-08-14 (×2): 60 mg via INTRAVENOUS
  Filled 2015-08-13 (×2): qty 2

## 2015-08-13 MED ORDER — ACETAMINOPHEN 325 MG PO TABS
650.0000 mg | ORAL_TABLET | Freq: Four times a day (QID) | ORAL | Status: DC | PRN
  Filled 2015-08-13: qty 2

## 2015-08-13 MED ORDER — DILTIAZEM HCL 25 MG/5ML IV SOLN
10.0000 mg | Freq: Once | INTRAVENOUS | Status: AC
  Administered 2015-08-13: 10 mg via INTRAVENOUS
  Filled 2015-08-13: qty 5

## 2015-08-13 MED ORDER — IPRATROPIUM-ALBUTEROL 0.5-2.5 (3) MG/3ML IN SOLN
9.0000 mL | Freq: Once | RESPIRATORY_TRACT | Status: AC
  Administered 2015-08-13: 9 mL via RESPIRATORY_TRACT
  Filled 2015-08-13: qty 9

## 2015-08-13 MED ORDER — SODIUM CHLORIDE 0.9 % IJ SOLN: 3.0000 mL | INTRAMUSCULAR | Status: DC | PRN

## 2015-08-13 MED ORDER — LEVOFLOXACIN IN D5W 250 MG/50ML IV SOLN
250.0000 mg | INTRAVENOUS | Status: DC
  Filled 2015-08-13: qty 50

## 2015-08-13 MED ORDER — LETROZOLE 2.5 MG PO TABS
2.5000 mg | ORAL_TABLET | Freq: Every day | ORAL | Status: DC
  Filled 2015-08-13 (×2): qty 1

## 2015-08-13 MED ORDER — LOSARTAN POTASSIUM 50 MG PO TABS
100.0000 mg | ORAL_TABLET | ORAL | Status: DC
  Administered 2015-08-14: 100 mg via ORAL
  Filled 2015-08-13 (×2): qty 2

## 2015-08-13 MED ORDER — SODIUM CHLORIDE 0.9 % IJ SOLN
3.0000 mL | Freq: Two times a day (BID) | INTRAMUSCULAR | Status: DC
  Administered 2015-08-14 (×2): 3 mL via INTRAVENOUS

## 2015-08-13 MED ORDER — DILTIAZEM HCL 25 MG/5ML IV SOLN
10.0000 mg | Freq: Four times a day (QID) | INTRAVENOUS | Status: DC | PRN
  Administered 2015-08-13: 10 mg via INTRAVENOUS

## 2015-08-13 MED ORDER — ONDANSETRON HCL 4 MG PO TABS: 4.0000 mg | ORAL_TABLET | Freq: Four times a day (QID) | ORAL | Status: DC | PRN

## 2015-08-13 NOTE — H&P (Signed)
[place] [facility] [place] NAME: Elizabeth Farmer    MR#:  [id number]  DATE OF BIRTH:  29-Apr-1936  DATE OF ADMISSION:  08/13/2015  PRIMARY CARE PHYSICIAN: No primary care provider on file.   REQUESTING/REFERRING PHYSICIAN: Tresa Endo  CHIEF COMPLAINT:   Chief Complaint  Patient presents with  . Respiratory Distress    HISTORY OF PRESENT ILLNESS: Elizabeth Farmer  is a 79 y.o. female with a known history of COPD, congestive heart failure, dementia, early Parkinson's disease, hypertension who currently resides at the twin [place] home who is bedbound who earlier today was having nausea and vomiting then subsequently was noted to have respiratory distress by her sister. EMS was called. Patient was noted to have respiratory difficulties and had to be placed on BiPAP. She is been on BiPAP since being in the ER. She has dementia and unable to give any review of systems. Her sister is at bedside. She reports that patient usually is not able to communicate much. And is wheelchair bound. PAST MEDICAL HISTORY:   Past Medical History  Diagnosis Date  . COPD (chronic obstructive pulmonary disease)   . CHF (congestive heart failure)   . HTN (hypertension)   . Dementia   . Parkinson disease   . Breast cancer     PAST SURGICAL HISTORY:  Past Surgical History  Procedure Laterality Date  . Breast suergy    . Hip fracture surgery      SOCIAL HISTORY:  Social History  Substance Use Topics  . Smoking status: Never Smoker   . Smokeless tobacco: Never Used  . Alcohol Use: No    FAMILY HISTORY:  Family History  Problem Relation Age of Onset  . Hypertension      DRUG ALLERGIES:  Allergies  Allergen Reactions  . Aspirin     Other reaction(s): Unknown  . Fish-Derived Products Other (See Comments)    Unknown reaction-MAR  . Iodine   . Salicylates   . Sulfites   . Yellow Dyes (Non-Tartrazine) Other (See Comments)    Unknown  reaction-MAR    REVIEW OF SYSTEMS:  Unable to provide due to her mental status and dementia  MEDICATIONS AT HOME:  Prior to Admission medications   Medication Sig Start Date End Date Taking? Authorizing Provider  acetaminophen (TYLENOL) 500 MG tablet Take 500 mg by mouth every 6 (six) hours as needed for mild pain, moderate pain or fever. Do not exceed 4 gm apap/day from all sources.   Yes Historical Provider, MD  albuterol (PROVENTIL HFA;VENTOLIN HFA) 108 (90 BASE) MCG/ACT inhaler Inhale 2 puffs into the lungs every 4 (four) hours as needed for wheezing.   Yes Historical Provider, MD  Cholecalciferol (VITAMIN D3) 50000 UNITS CAPS Take 50,000 Units by mouth every 30 (thirty) days. Take on the 27th.   Yes Historical Provider, MD  Fluticasone-Salmeterol (ADVAIR) 250-50 MCG/DOSE AEPB Inhale 1 puff into the lungs 2 (two) times daily.   Yes Historical Provider, MD  furosemide (LASIX) 20 MG tablet Take 20 mg by mouth every morning.   Yes Historical Provider, MD  letrozole (FEMARA) 2.5 MG tablet Take 2.5 mg by mouth at bedtime.   Yes Historical Provider, MD  losartan (COZAAR) 100 MG tablet Take 100 mg by mouth every morning.   Yes Historical Provider, MD  POLYETHYLENE GLYCOL 3350 PO Take 17 g by mouth daily. Mix in [place] of fluid. *use large size*   Yes Historical Provider, MD  potassium chloride (  K-DUR,KLOR-CON) 10 MEQ tablet Take 10 mEq by mouth 2 (two) times daily.   Yes Historical Provider, MD  senna-docusate (SENOKOT S) 8.6-50 MG per tablet Take 2 tablets by mouth daily as needed for mild constipation or moderate constipation.   Yes Historical Provider, MD  Umeclidinium Bromide (INCRUSE ELLIPTA) 62.5 MCG/INH AEPB Inhale 1 puff into the lungs daily.   Yes Historical Provider, MD      PHYSICAL EXAMINATION:   VITAL SIGNS: Blood pressure 150/105, pulse 140, temperature 98.2 F (36.8 C), temperature source Oral, resp. rate 31, height 5\' 4"  (1.626 m), weight 75.297 kg (166 lb), SpO2 96 %.  GENERAL:   79 y.o.-year-old patient lying in the bed respiratory distress critically ill-appearing has BiPAP on place EYES: Pupils equal, round, reactive to light and accommodation. No scleral icterus.  HEENT: Head atraumatic, normocephalic. Oropharynx and nasopharynx clear.  NECK:  Supple, no jugular venous distention. No thyroid enlargement, no tenderness.  LUNGS: Has a sister muscle usage has BiPAP in place, has occasional wheezing no rhonchi CARDIOVASCULAR: S1, S2 normal. Tachycardic No murmurs, rubs, or gallops.  ABDOMEN: Soft, nontender, nondistended. Bowel sounds present. No organomegaly or mass.  EXTREMITIES: No pedal edema, cyanosis, or clubbing.  NEUROLOGIC: Limited ,  not following commands PSYCHIATRIC: The patient is alert and oriented x 3.  SKIN: No obvious rash, lesion, or ulcer.   LABORATORY PANEL:   CBC  Recent Labs Lab 08/13/15 1610  WBC 17.2*  HGB 15.3  HCT 46.9  PLT 167  MCV 86.3  MCH 28.2  MCHC 32.6  RDW 13.8  LYMPHSABS 0.4*  MONOABS 0.6  EOSABS 0.0  BASOSABS 0.0   ------------------------------------------------------------------------------------------------------------------  Chemistries   Recent Labs Lab 08/13/15 1610  NA 138  K 4.1  CL 100*  CO2 26  GLUCOSE 159*  BUN 17  CREATININE 1.11*  CALCIUM 10.3  AST 44*  ALT 27  ALKPHOS 101  BILITOT 1.2   ------------------------------------------------------------------------------------------------------------------ estimated creatinine clearance is 42.1 mL/min (by C-G formula based on Cr of 1.11). ------------------------------------------------------------------------------------------------------------------ No results for input(s): TSH, T4TOTAL, T3FREE, THYROIDAB in the last 72 hours.  Invalid input(s): FREET3   Coagulation profile No results for input(s): INR, PROTIME in the last 168  hours. ------------------------------------------------------------------------------------------------------------------- No results for input(s): DDIMER in the last 72 hours. -------------------------------------------------------------------------------------------------------------------  Cardiac Enzymes  Recent Labs Lab 08/13/15 1610  TROPONINI <0.03   ------------------------------------------------------------------------------------------------------------------ Invalid input(s): POCBNP  ---------------------------------------------------------------------------------------------------------------  Urinalysis    Component Value Date/Time   COLORURINE Yellow 03/21/2015 0040   APPEARANCEUR Hazy 03/21/2015 0040   LABSPEC 1.026 03/21/2015 0040   PHURINE 5.0 03/21/2015 0040   GLUCOSEU Negative 03/21/2015 0040   HGBUR Negative 03/21/2015 0040   BILIRUBINUR Negative 03/21/2015 0040   KETONESUR Negative 03/21/2015 0040   PROTEINUR 30 mg/dL 03/21/2015 0040   NITRITE Negative 03/21/2015 0040   LEUKOCYTESUR Trace 03/21/2015 0040     RADIOLOGY: Dg Chest 1 View  08/13/2015   CLINICAL DATA:  Shortness of breath.  EXAM: CHEST  1 VIEW  COMPARISON:  03/21/2015  FINDINGS: Normal heart size. Aortic atherosclerosis noted. There is asymmetric elevation of the right hemidiaphragm. No pleural effusion identified. No airspace consolidation.  IMPRESSION: 1. Low lung volumes. 2. Asymmetric elevation of right hemidiaphragm. 3. Aortic atherosclerosis   Electronically Signed   By: Kerby Moors M.D.   On: 08/13/2015 16:39    EKG: Orders placed or performed during the hospital encounter of 08/13/15  . EKG 12-Lead  . EKG 12-Lead    IMPRESSION AND PLAN: Patient is  a 79 year old white female is being brought in with acute respiratory failure  1. Acute respiratory failure: Suspect due to COPD flare, aspiration pneumonia is a possibility with her having nausea and vomiting earlier today, also  bedbound certainly at high risk of PE is a CT per PE protocol is ordered currently pending. Patient will be started on nebulizers every 6 hours. IV Solu-Medrol her home COPD regimen will be continued continue BiPAP. She will also be on IV Levaquin, swallow eval will also be obtained for concern for aspiration  2. History of CHF type unknown: Early compensated monitor respiratory status continue Lasix  3. History of breast cancer continue Femara  4. Miscellaneous: Lovenox for DVT prophylaxis    All the records are reviewed and case discussed with ED provider. Management plans discussed with the patient, family and they are in agreement.  CODE STATUS: DO NOT RESUSCITATE Advance Directive Documentation        Most Recent Value   Type of Advance Directive  Out of facility DNR (pink MOST or yellow form)   Pre-existing out of facility DNR order (yellow form or pink MOST form)     "MOST" Form in Place?         TOTAL TIME TAKING CARE OF THIS PATIENT: 55 minutes. Critical care time   Dustin Flock M.D on 08/13/2015 at 5:59 PM  Between 7am to 6pm - Pager - [phone]  After 6pm go to [email or web address] - password [email or web address] [facility]  [facility]  Office  [phone]  CC: Primary care physician; No primary care provider on file.

## 2015-08-13 NOTE — ED Provider Notes (Signed)
[facility] Emergency Department Provider Note  ____________________________________________  Time seen: Approximately 4 PM  I have reviewed the triage vital signs and the nursing notes.   HISTORY  Chief Complaint Respiratory Distress    HPI Elizabeth Farmer is a 79 y.o. female with a history of COPD and CHF who is presenting today with shortness of breath which is worsening over the past 2 days. The patient denies any pain. Was started on CPAP en route because of work of breathing as well as oxygen saturations in the low 90s. No history of fever. She is not on any anticoagulants.   Past Medical History  Diagnosis Date  . COPD (chronic obstructive pulmonary disease)   . CHF (congestive heart failure)     There are no active problems to display for this patient.   History reviewed. No pertinent past surgical history.  No current outpatient prescriptions on file.  Allergies Iodine; Salicylates; and Sulfites  History reviewed. No pertinent family history.  Social History Social History  Substance Use Topics  . Smoking status: Never Smoker   . Smokeless tobacco: Never Used  . Alcohol Use: No    Review of Systems Constitutional: No fever/chills Eyes: No visual changes. ENT: No sore throat. Cardiovascular: Denies chest pain. Respiratory: As above Gastrointestinal: No abdominal pain.  No nausea, no vomiting.  No diarrhea.  No constipation. Genitourinary: Negative for dysuria. Musculoskeletal: Negative for back pain. Skin: Negative for rash. Neurological: Negative for headaches, focal weakness or numbness.  10-point ROS otherwise negative.  ____________________________________________   PHYSICAL EXAM:  VITAL SIGNS: ED Triage Vitals  Enc Vitals Group     BP 08/13/15 1557 164/119 mmHg     Pulse Rate 08/13/15 1557 138     Resp --      Temp 08/13/15 1557 98 F (36.7 C)     Temp Source 08/13/15 1557 Oral     SpO2 08/13/15 1557 99 %      Weight 08/13/15 1557 166 lb (75.297 kg)     Height 08/13/15 1557 5\' 4"  (1.626 m)     Head Cir --      Peak Flow --      Pain Score --      Pain Loc --      Pain Edu? --      Excl. in [place]? --     Constitutional: Alert and oriented. in no acute distress. Tolerating the BiPAP well. Eyes: Conjunctivae are normal. PERRL. EOMI. Head: Atraumatic. Nose: No congestion/rhinnorhea. Mouth/Throat: Mucous membranes are moist.  Oropharynx non-erythematous. Neck: No stridor.   Cardiovascular: Normal rate, regular rhythm. Grossly normal heart sounds.  Good peripheral circulation. Respiratory: Mild increased work of breathing. No retractions. Has an extra cough. Decreased lung sounds to the lower fields. However, no audible rales or wheezes. Gastrointestinal: Soft and nontender. No distention. No abdominal bruits. No CVA tenderness. Musculoskeletal: Mild bilateral pitting edema. Extends to the mid calf from ankles. Neurologic:  Normal speech and language. No gross focal neurologic deficits are appreciated. No gait instability. Skin:  Skin is warm, dry and intact. No rash noted. Psychiatric: Mood and affect are normal. Speech and behavior are normal.  ____________________________________________   LABS (all labs ordered are listed, but only abnormal results are displayed)  Labs Reviewed  CBC WITH DIFFERENTIAL/PLATELET - Abnormal; Notable for the following:    WBC 17.2 (*)    RBC 5.44 (*)    Neutro Abs 16.2 (*)    Lymphs Abs 0.4 (*)  All other components within normal limits  COMPREHENSIVE METABOLIC PANEL - Abnormal; Notable for the following:    Chloride 100 (*)    Glucose, Bld 159 (*)    Creatinine, Ser 1.11 (*)    Total Protein 8.5 (*)    AST 44 (*)    GFR calc non Af Amer 47 (*)    GFR calc Af Amer 54 (*)    All other components within normal limits  TROPONIN I  BRAIN NATRIURETIC PEPTIDE   ____________________________________________  EKG  ED ECG REPORT I, Doran Stabler, the attending physician, personally viewed and interpreted this ECG.   Date: 08/13/2015  EKG Time: 1600  Rate: 138  Rhythm: sinus tachycardia  Axis: Normal axis  Intervals:none  ST&T Change: No abnormal ST elevations or depressions. S1 every 3 T3 pattern which is unchanged from previous EKG of January 2016. T-wave inversion in aVF which is unchanged from the EKG of 10/10/2014.  ____________________________________________  RADIOLOGY  X-rays the chest with low lung volumes. I personally reviewed these films. ____________________________________________   PROCEDURES  CRITICAL CARE Performed by: Doran Stabler   Total critical care time: 35 minutes.  Critical care time was exclusive of separately billable procedures and treating other patients.  Critical care was necessary to treat or prevent imminent or life-threatening deterioration.  Critical care was time spent personally by me on the following activities: development of treatment plan with patient and/or surrogate as well as nursing, discussions with consultants, evaluation of patient's response to treatment, examination of patient, obtaining history from patient or surrogate, ordering and performing treatments and interventions, ordering and review of laboratory studies, ordering and review of radiographic studies, pulse oximetry and re-evaluation of patient's condition.   ____________________________________________   INITIAL IMPRESSION / ASSESSMENT AND PLAN / ED COURSE  Pertinent labs & imaging results that were available during my care of the patient were reviewed by me and considered in my medical decision making (see chart for details).  ----------------------------------------- 4:57 PM on 08/13/2015 -----------------------------------------  Patient continues to tolerate BiPAP well. However, heart rate is still in the 130s. Concern for pulmonary embolus. Patient does have an iodine allergy listed on her  allergies. However, has had contrast media scans in the past. Discussed with her sister who does not know her reaction. CT tech aware.  ----------------------------------------- 5:33 PM on 08/13/2015 -----------------------------------------  Patient unable to have CAT scan of her chest. Radiology recommends 18 hour prep. We'll treat patient for COPD. Will be prepping with IV steroids. It tachycardia does not resolve with treatment of COPD as well as fluids proceed with CAT scan to rule out pulmonary embolus. Patient doesn't a history of COPD, however with her heart rate being so I am concerned that there may be another acute condition contributing to the patient's respiratory distress.Burnis Medin admit to the medicine service. Signed out to Dr. Posey Pronto. ____________________________________________   FINAL CLINICAL IMPRESSION(S) / ED DIAGNOSES  Acute COPD exacerbation. Acute sinus tachycardia.    Orbie Pyo, MD 08/13/15 [phone]

## 2015-08-13 NOTE — ED Notes (Signed)
Pt from [facility] with progressively more difficulty breathing since last night. EMS states HR 140-180 with a-fib during their assessment. Also report clear lunch uppers with greatly diminished bases.Pt has expressive aphasia due to prior cva.

## 2015-08-13 NOTE — Progress Notes (Signed)
eLink Physician-Brief Progress Note Patient Name: Elizabeth Farmer DOB: 07/23/1936 MRN: [id number]   Date of Service  08/13/2015  HPI/Events of Note  Chart reviewed 79 y/o female with COPD, CHF admitted with acute hypoxemic respiratory failure from COPD exac and possibly aspiration On camera check her BIPAP mask was on her chin, strap covering her eyes  eICU Interventions  RT to adjust mask Continue current management     Intervention Category Evaluation Type: New Patient Evaluation  [name] 08/13/2015, 9:14 PM

## 2015-08-14 ENCOUNTER — Telehealth: Payer: Self-pay

## 2015-08-14 ENCOUNTER — Inpatient Hospital Stay: Payer: Medicare Other

## 2015-08-14 ENCOUNTER — Encounter: Payer: Self-pay | Admitting: Radiology

## 2015-08-14 DIAGNOSIS — G4733 Obstructive sleep apnea (adult) (pediatric): Secondary | ICD-10-CM

## 2015-08-14 DIAGNOSIS — R401 Stupor: Secondary | ICD-10-CM

## 2015-08-14 DIAGNOSIS — J9601 Acute respiratory failure with hypoxia: Secondary | ICD-10-CM

## 2015-08-14 LAB — BASIC METABOLIC PANEL
ANION GAP: 9 (ref 5–15)
BUN: 20 mg/dL (ref 6–20)
CALCIUM: 10.4 mg/dL — AB (ref 8.9–10.3)
CO2: 26 mmol/L (ref 22–32)
Chloride: 106 mmol/L (ref 101–111)
Creatinine, Ser: 0.93 mg/dL (ref 0.44–1.00)
GFR calc Af Amer: >60 mL/min (ref >60)
GFR calc non Af Amer: 58 mL/min — ABNORMAL LOW (ref >60)
GLUCOSE: 242 mg/dL — AB (ref 65–99)
Potassium: 3.7 mmol/L (ref 3.5–5.1)
Sodium: 141 mmol/L (ref 135–145)

## 2015-08-14 LAB — CBC
HEMATOCRIT: 40.9 % (ref 35.0–47.0)
Hemoglobin: 13.5 g/dL (ref 12.0–16.0)
MCH: 28.6 pg (ref 26.0–34.0)
MCHC: 32.9 g/dL (ref 32.0–36.0)
MCV: 87.1 fL (ref 80.0–100.0)
Platelets: 162 10*3/uL (ref 150–440)
RBC: 4.7 MIL/uL (ref 3.80–5.20)
RDW: 14.1 % (ref 11.5–14.5)
WBC: 22.6 10*3/uL — ABNORMAL HIGH (ref 3.6–11.0)

## 2015-08-14 LAB — GLUCOSE, CAPILLARY: Glucose-Capillary: 173 mg/dL — ABNORMAL HIGH (ref 65–99)

## 2015-08-14 MED ORDER — ARFORMOTEROL TARTRATE 15 MCG/2ML IN NEBU
15.0000 ug | INHALATION_SOLUTION | Freq: Two times a day (BID) | RESPIRATORY_TRACT | Status: DC
  Administered 2015-08-14 – 2015-08-15 (×3): 15 ug via RESPIRATORY_TRACT
  Filled 2015-08-14 (×5): qty 2

## 2015-08-14 MED ORDER — HALOPERIDOL LACTATE 5 MG/ML IJ SOLN
5.0000 mg | Freq: Once | INTRAMUSCULAR | Status: AC
  Administered 2015-08-14: 5 mg via INTRAVENOUS
  Filled 2015-08-14: qty 1

## 2015-08-14 MED ORDER — DIPHENHYDRAMINE HCL 25 MG PO CAPS
50.0000 mg | ORAL_CAPSULE | Freq: Once | ORAL | Status: DC
  Filled 2015-08-14: qty 2

## 2015-08-14 MED ORDER — INSULIN ASPART 100 UNIT/ML ~~LOC~~ SOLN
0.0000 [IU] | Freq: Three times a day (TID) | SUBCUTANEOUS | Status: DC
  Administered 2015-08-14: 2 [IU] via SUBCUTANEOUS
  Administered 2015-08-15: 5 [IU] via SUBCUTANEOUS
  Filled 2015-08-14: qty 1
  Filled 2015-08-14: qty 2

## 2015-08-14 MED ORDER — CETYLPYRIDINIUM CHLORIDE 0.05 % MT LIQD
7.0000 mL | Freq: Two times a day (BID) | OROMUCOSAL | Status: DC
  Administered 2015-08-14 – 2015-08-15 (×3): 7 mL via OROMUCOSAL

## 2015-08-14 MED ORDER — DIPHENHYDRAMINE HCL 50 MG/ML IJ SOLN
12.5000 mg | Freq: Once | INTRAMUSCULAR | Status: AC
  Administered 2015-08-14: 12.5 mg via INTRAVENOUS
  Filled 2015-08-14: qty 1

## 2015-08-14 MED ORDER — PREDNISONE 20 MG PO TABS: 50.0000 mg | ORAL_TABLET | Freq: Four times a day (QID) | ORAL | Status: DC

## 2015-08-14 MED ORDER — LEVOFLOXACIN IN D5W 500 MG/100ML IV SOLN
500.0000 mg | INTRAVENOUS | Status: DC
  Filled 2015-08-14: qty 100

## 2015-08-14 MED ORDER — IOHEXOL 350 MG/ML SOLN
75.0000 mL | Freq: Once | INTRAVENOUS | Status: AC | PRN
  Administered 2015-08-14: 75 mL via INTRAVENOUS

## 2015-08-14 MED ORDER — CHLORHEXIDINE GLUCONATE 0.12 % MT SOLN
15.0000 mL | Freq: Two times a day (BID) | OROMUCOSAL | Status: DC
Start: 2015-08-14 — End: 2015-08-15
  Administered 2015-08-14 – 2015-08-15 (×4): 15 mL via OROMUCOSAL
  Filled 2015-08-14 (×3): qty 15

## 2015-08-14 MED ORDER — LEVOFLOXACIN IN D5W 750 MG/150ML IV SOLN
750.0000 mg | INTRAVENOUS | Status: DC
  Administered 2015-08-14: 750 mg via INTRAVENOUS
  Filled 2015-08-14: qty 150

## 2015-08-14 MED ORDER — BUDESONIDE 0.25 MG/2ML IN SUSP
0.2500 mg | Freq: Two times a day (BID) | RESPIRATORY_TRACT | Status: DC
  Administered 2015-08-14 – 2015-08-15 (×3): 0.25 mg via RESPIRATORY_TRACT
  Filled 2015-08-14 (×3): qty 2

## 2015-08-14 MED ORDER — INSULIN ASPART 100 UNIT/ML ~~LOC~~ SOLN: 0.0000 [IU] | Freq: Every day | SUBCUTANEOUS | Status: DC

## 2015-08-14 MED ORDER — HALOPERIDOL LACTATE 5 MG/ML IJ SOLN
2.5000 mg | Freq: Once | INTRAMUSCULAR | Status: AC
  Administered 2015-08-14: 2.5 mg via INTRAVENOUS
  Filled 2015-08-14: qty 1

## 2015-08-14 NOTE — Clinical Social Work Note (Signed)
CSW received a consult explaining that pt is a resident at [facility].  CSW spoke to the admissions coordinator at [facility] who confirmed that pt is a long term care resident at their facility.  Staff at [facility] explained that pt's sister is her primary contact.  CSW will contact pt's sister in order to gather further information.  [place], [place]

## 2015-08-14 NOTE — Progress Notes (Signed)
°   08/14/15 1100  Clinical Encounter Type  Visited With Patient and family together  Visit Type Initial (Pastoral Care and prayer provided.)  Referral From Family  Spiritual Encounters  Spiritual Needs Prayer;Grief support;Other (Comment)

## 2015-08-14 NOTE — Consult Note (Signed)
PULMONARY/CCM CONSULT NOTE  Requesting MD/Service: IM Date of admission: 08/13/15 Date of consult: 08/14/15 Reason for consultation: acute respiratory failure  HPI:  80 F NH resident with multiple chronic medical problems inc advanced dementia who was in usual state of health when visited by her sister 9/10. On 9/11, she was again visited by her sister and was noted to be more somnolent than her baseline with increased respiratory effort. Therefore, she was transported to [facility] ED for further eval where she was noted to be in respiratory distress and started on NPPV. Her code status is noted to be full DNR. She was reported to have had some N/V earlier in the day. She was admitted by IM to ICU/SDu with dx of possible aspiration and supported with BiPAP. At the time of this eval, she was awake and alert. There was no overt distress after removal of the BiPAP support. She is poorly oriented and unable to provide much history. She denies pain or dyspnea. She exhibits innumerable obstructive apneas that even occur when she is apparently awake.  Past Medical History  Diagnosis Date  . COPD (chronic obstructive pulmonary disease)   . CHF (congestive heart failure)   . HTN (hypertension)   . Dementia   . Parkinson disease   . Breast cancer     MEDICATIONS: reviewed  Social History   Social History  . Marital Status: Widowed    Spouse Name: N/A  . Number of Children: N/A  . Years of Education: N/A   Occupational History  . Not on file.   Social History Main Topics  . Smoking status: Never Smoker   . Smokeless tobacco: Never Used  . Alcohol Use: No  . Drug Use: No  . Sexual Activity: Not on file   Other Topics Concern  . Not on file   Social History Narrative    Family History  Problem Relation Age of Onset  . Hypertension      ROS - Unable ot obtain due to poor cognitive capabilities  Filed Vitals:   08/14/15 1200 08/14/15 1206 08/14/15 1300 08/14/15 1400  BP: 166/97   123/74 129/94  Pulse: 107  110 116  Temp:  98.3 F (36.8 C)    TempSrc:  Oral    Resp: 26  24 24   Height:      Weight:      SpO2: 100% 100% 100% 92%    EXAM:  Gen: chronically ill appearing, Overt obst apneas with freq snorting respirations noted, RASS 0, -1 HEENT: NCAT, WNL Neck: increased girth, JVP not visualized, no TM Lungs: clear to ausc and percussion Cardiovascular: reg, no M Abdomen: obese, soft, NT Ext: warm, no edema Neuro: No focal deficits  DATA:     BMP Latest Ref Rng 08/14/2015 08/13/2015 03/24/2015  Glucose 65 - 99 mg/dL 242(H) 159(H) 153(H)  BUN 6 - 20 mg/dL 20 17 23(H)  Creatinine 0.44 - 1.00 mg/dL 0.93 1.11(H) 0.65  Sodium 135 - 145 mmol/L 141 138 144  Potassium 3.5 - 5.1 mmol/L 3.7 4.1 3.4(L)  Chloride 101 - 111 mmol/L 106 100(L) 106  CO2 22 - 32 mmol/L 26 26 29   Calcium 8.9 - 10.3 mg/dL 10.4(H) 10.3 9.5    CBC Latest Ref Rng 08/14/2015 08/13/2015 03/24/2015  WBC 3.6 - 11.0 K/uL 22.6(H) 17.2(H) 8.3  Hemoglobin 12.0 - 16.0 g/dL 13.5 15.3 13.5  Hematocrit 35.0 - 47.0 % 40.9 46.9 40.1  Platelets 150 - 440 K/uL 162 167 154    CXR:  Normal heart size. There is asymmetric elevation of the right hemidiaphragm. No pleural effusion identified. No airspace consolidation CT chest: No obvious PE. Minimal opacification of BLL   IMPRESSION:   1) Respiratory distress - episode of emesis followed by respiratory distress on admission might represent an aspiration event. The distress is now resolved and she is comfortable on [place] O2  2) She has hx of of OSA and I suspect there is a component of OHS. She is mildly somnolent which likely represents mild worsening of hypercarbia. Amazingly, she even exhibits episodes of upper airway obstruction while seemingly awake and these are reversed with a simple jaw lift. In the past, she has been intolerant to nocturnal CPAP  PLAN/REC:  Change BiPAP to PRN during day and mandatory with sleep (if she will wear it) Titrate O2  carefully maintaining SpO2 90-94% Check TSH to R/O hypothyroidism Agree with current abx though I'm not entirely convinced that there is an infectious component to this  Check PCT in AM and consider DC of abx if low DNR status is appropriate as her dementia is quite advanced   Merton Border, MD PCCM service Mobile [phone] Pager [phone]

## 2015-08-14 NOTE — Care Management Note (Signed)
Case Management Note  Patient Details  Name: Elizabeth Farmer MRN: [id number] Date of Birth: [birth year]  Subjective/Objective:   Patient from [facility]. CSW consult in. Hx of demenita and parkinsons. Currently on Bipap.                  Action/Plan: SNF at discharge when medically stable  Expected Discharge Date:                  Expected Discharge Plan:  [place]  In-House Referral:  Clinical Social Work  Discharge planning Services     Post Acute Care Choice:    Choice offered to:     DME Arranged:    DME Agency:     HH Arranged:    [place] Agency:     Status of Service:  In process, will continue to follow  Medicare Important Message Given:    Date Medicare IM Given:    Medicare IM give by:    Date Additional Medicare IM Given:    Additional Medicare Important Message give by:     If discussed at [place] of Stay Meetings, dates discussed:    Additional Comments:  Jolly Mango, RN 08/14/2015, 11:21 AM

## 2015-08-14 NOTE — Telephone Encounter (Signed)
Apparent PE Will check hospital records

## 2015-08-14 NOTE — Telephone Encounter (Signed)
Pt was seen [facility] ED on 08/13/15 and is admitted to [facility] ICU rm 17A.

## 2015-08-14 NOTE — Telephone Encounter (Signed)
No PE on angiogram May have aspiration pneumonitis

## 2015-08-14 NOTE — Progress Notes (Signed)
SPEECH THERAPY NOTE:  Received order, reviewed chart and spoke with NSG/RT.  Pt is currently on BiPAP and not expected to wean at this time per RT. Pt should remain NPO while on BiPAP due to increased risk for aspiration.  ST services will f/u when Pt is weaned from BiPAP. NSG agreed.

## 2015-08-14 NOTE — Progress Notes (Signed)
[facility] [place] NAME: Elizabeth Farmer    MR#:  [id number]  DATE OF BIRTH:  [birth year]  SUBJECTIVE:  CHIEF COMPLAINT:  Patient is resting comfortably. She is nonverbal, off BiPAP on oxygen via nasal cannula  REVIEW OF SYSTEMS:  Unable to provide due to her mental status and dementia  DRUG ALLERGIES:   Allergies  Allergen Reactions  . Aspirin     Other reaction(s): Unknown  . Fish-Derived Products Other (See Comments)    Unknown reaction-MAR  . Iodine   . Salicylates   . Sulfites   . Yellow Dyes (Non-Tartrazine) Other (See Comments)    Unknown reaction-MAR    VITALS:  Blood pressure 129/94, pulse 116, temperature 98.3 F (36.8 C), temperature source Oral, resp. rate 24, height 5\' 2"  (1.575 m), weight 71.9 kg (158 lb 8.2 oz), SpO2 92 %.  PHYSICAL EXAMINATION:  GENERAL:  79 y.o.-year-old patient lying in the bed with no acute distress.  EYES: Pupils equal, round, reactive to light and accommodation. No scleral icterus.  HEENT: Head atraumatic, normocephalic. Oropharynx and nasopharynx clear.  NECK:  Supple, no jugular venous distention. No thyroid enlargement, no tenderness.  LUNGS: Moderate bronchial breath sounds, no wheezing, positive rales,rhonchi . No use of accessory muscles of respiration.  CARDIOVASCULAR: S1, S2 normal. No murmurs, rubs, or gallops.  ABDOMEN: Soft, nontender, nondistended. Bowel sounds present. No organomegaly or mass.  EXTREMITIES: No pedal edema, cyanosis, or clubbing.  NEUROLOGIC: Cranial nerves II through XII are intact. Muscle strength 5/5 in all extremities. Sensation intact. Gait not checked.  PSYCHIATRIC: The patient is alert and oriented x 3.  SKIN: No obvious rash, lesion, or ulcer.    LABORATORY PANEL:   CBC  Recent Labs Lab 08/14/15 0430  WBC 22.6*  HGB 13.5  HCT 40.9  PLT 162    ------------------------------------------------------------------------------------------------------------------  Chemistries   Recent Labs Lab 08/13/15 1610 08/14/15 0430  NA 138 141  K 4.1 3.7  CL 100* 106  CO2 26 26  GLUCOSE 159* 242*  BUN 17 20  CREATININE 1.11* 0.93  CALCIUM 10.3 10.4*  AST 44*  --   ALT 27  --   ALKPHOS 101  --   BILITOT 1.2  --    ------------------------------------------------------------------------------------------------------------------  Cardiac Enzymes  Recent Labs Lab 08/13/15 1610  TROPONINI <0.03   ------------------------------------------------------------------------------------------------------------------  RADIOLOGY:  Dg Chest 1 View  08/13/2015   CLINICAL DATA:  Shortness of breath.  EXAM: CHEST  1 VIEW  COMPARISON:  03/21/2015  FINDINGS: Normal heart size. Aortic atherosclerosis noted. There is asymmetric elevation of the right hemidiaphragm. No pleural effusion identified. No airspace consolidation.  IMPRESSION: 1. Low lung volumes. 2. Asymmetric elevation of right hemidiaphragm. 3. Aortic atherosclerosis   Electronically Signed   By: Kerby Moors M.D.   On: 08/13/2015 16:39   Ct Angio Chest Pe W/cm &/or Wo Cm  08/14/2015   CLINICAL DATA:  79 year old female with respiratory distress following an episode of nausea and vomiting. Medical history includes COPD, congestive heart failure and dementia. History of iodine allergy. Patient pre-medicated prior to administration of intravenous contrast.  EXAM: CT ANGIOGRAPHY CHEST WITH CONTRAST  TECHNIQUE: Multidetector CT imaging of the chest was performed using the standard protocol during bolus administration of intravenous contrast. Multiplanar CT image reconstructions and MIPs were obtained to evaluate the vascular anatomy.  CONTRAST:  44mL OMNIPAQUE IOHEXOL 350 MG/ML SOLN  COMPARISON:  Chest x-ray 08/13/2015; prior CT scan of the chest 02/26/2012  FINDINGS: Mediastinum: Unremarkable  CT appearance of the thyroid gland. No suspicious mediastinal or hilar adenopathy. No soft tissue mediastinal mass. Small hiatal hernia.  Heart/Vascular: Conventional 3 vessel aortic arch morphology. Mild aneurysmal dilatation of the ascending thoracic aorta with a maximal diameter of 4.1 cm. The transverse and descending thoracic aorta are normal in caliber. There is adequate opacification of the pulmonary arteries to the proximal subsegmental level. However, evaluation beyond the segmental level is limited by respiratory motion artifact. No evidence of central filling defect to suggest acute pulmonary embolus. The main pulmonary artery is within normal limits for size. The heart is within normal limits for size. No pericardial effusion.  Lungs/Pleura: Moderate respiratory motion artifact limits evaluation for small pulmonary nodules. Focus of a micro nodularity and patchy ground-glass attenuation opacity in the right lung apex. The larger nodular opacities are unchanged dating back to 2013. Additional scattered pulmonary nodules are also stable dating back to 2013 consistent with a benign and likely granulomatous etiology. The largest individual nodule again measures 10 mm in the right middle lobe. There is associated adjacent linear pleural parenchymal scarring. Mild bronchial wall thickening. Dependent atelectasis in the lower lobes.  Bones/Soft Tissues: No acute fracture or aggressive appearing lytic or blastic osseous lesion. Surgical changes of prior right mastectomy.  Upper Abdomen: 5- 6 mm stone in the upper pole collecting system of the left kidney is incompletely imaged. The liver is diffusely low in attenuation consistent with hepatic steatosis. There is evidence of sparing around the gallbladder fossa. Otherwise, unremarkable imaged upper abdomen.  Review of the MIP images confirms the above findings.  IMPRESSION: 1. Moderate respiratory motion artifact limits evaluation of the smaller pulmonary  arteries. Within these limitations, there is no evidence of acute pulmonary embolus. 2. Low inspiratory volumes with bibasilar atelectasis, lower lobe bronchial wall thickening and very mild patchy ground-glass attenuation opacity. Given the reported clinical history of respiratory distress following an episode of emesis, small volume aspiration is a consideration. 3. Stable micro and macro nodularity with adjacent pleural-parenchymal scarring throughout the right lung dating back to 2013 consistent with sequelae of old granulomatous disease. 4. Mild aneurysmal dilatation of the ascending thoracic aorta with a maximal diameter of 4.1 cm. Recommend annual imaging followup by CTA or MRA. This recommendation follows 2010 ACCF/AHA/AATS/ACR/ASA/SCA/SCAI/SIR/STS/SVM Guidelines for the Diagnosis and Management of Patients with Thoracic Aortic Disease. Circulation. 2010; 121: e266-e369 5. Small hiatal hernia. 6. Hepatic steatosis. 7. Incompletely imaged 5- 6 mm stone in the upper pole collecting system of the left kidney.   Electronically Signed   By: Jacqulynn Cadet M.D.   On: 08/14/2015 08:24    EKG:   Orders placed or performed during the hospital encounter of 08/13/15  . EKG 12-Lead  . EKG 12-Lead    ASSESSMENT AND PLAN:   Patient is a 79 year old white female is being brought in with acute respiratory failure  1. Acute respiratory failure with hypoxia: due to COPD flare n  aspiration pneumonia is a possibility with her having nausea and vomiting yesterday   CTA chest is negative for pulmonary embolism Continue on nebulizers every 6 hours. IV Solu-Medrol Weaned off  BiPAP.  Ciontinue on IV Levaquin,  NPO and swallow eval will also be obtained for concern for aspiration Appreciate pulmonology recommendations  2. History of CHF type unknown: Early compensated monitor respiratory status continue Lasix  3. History of breast cancer continue Femara  4. Chronic history of diabetes mellitus We'll  check hemoglobin A1c Patient is on  sliding scale insulin with Accu-Cheks Follow-up with diabetic coordinator  5. Chronic history of dementia  6. Miscellaneous: Lovenox for DVT prophylaxis  All the records are reviewed and case discussed with Care Management/Social Workerr. Management plans discussed with the patient, family and they are in agreement.  CODE STATUS: DO NOT RESUSCITATE  TOTAL TIME TAKING CARE OF THIS PATIENT: 40 minutes.   POSSIBLE D/C IN 2-3 DAYS, DEPENDING ON CLINICAL CONDITION.   Nicholes Mango M.D on 08/14/2015 at 3:28 PM  Between 7am to 6pm - Pager - [phone] After 6pm go to [email or web address] - password [email or web address] [facility]  [place] [facility]  Office  [phone]  CC: Primary care physician; No primary care provider on file.

## 2015-08-14 NOTE — Progress Notes (Signed)
Inpatient Diabetes Program Recommendations  AACE/ADA: New Consensus Statement on Inpatient Glycemic Control (2013)  Target Ranges:  Prepandial:   less than 140 mg/dL      Peak postprandial:   less than 180 mg/dL (1-2 hours)      Critically ill patients:  140 - 180 mg/dL   Results for Elizabeth Farmer, Elizabeth Farmer (MRN [id number]) as of 08/14/2015 10:15  Ref. Range 08/13/2015 16:10 08/14/2015 04:30  Glucose Latest Ref Range: 65-99 mg/dL 159 (H) 242 (H)    Diabetes history: No Outpatient Diabetes medications: NA Current orders for Inpatient glycemic control: None  Inpatient Diabetes Program Recommendations Correction (SSI): While inpatient and ordered steroids, please consider ordering CBGs with Novolog correction scale ACHS. HgbA1C: May want to consider ordering an A1C to evaluate glycemic control over the past 2-3 months.  Note: Patient does not have a documented history of diabetes. However, patient is receiving steroids which is likely cause of hyperglycemia.   Thanks, Barnie Alderman, RN, MSN, CCRN, CDE Diabetes Coordinator Inpatient Diabetes Program [phone] (Team Pager from [place] to [place]) [phone] ([facility] office) [phone] [facility] office) [phone] [facility] office)

## 2015-08-14 NOTE — Telephone Encounter (Signed)
PLEASE NOTE: All timestamps contained within this report are represented as [place] Standard Time. CONFIDENTIALTY NOTICE: This fax transmission is intended only for the addressee. It contains information that is legally privileged, confidential or otherwise protected from use or disclosure. If you are not the intended recipient, you are strictly prohibited from reviewing, disclosing, copying using or disseminating any of this information or taking any action in reliance on or regarding this information. If you have received this fax in error, please notify us immediately by telephone so that we can arrange for its return to [place]. Phone: [phone], Toll-Free: [phone], Fax: [phone] Page: 1 of 1 Call Id: [id number] [place] Patient Name: Elizabeth Farmer Gender: Female DOB: January 01, 1936 Age: 79 Y 5 M 10 D Return Phone Number: Address: City/State/Zip: [place] Client [place] Night - Client Client Site [place] Physician Viviana Simpler Contact Type Call Call Type Page Only Caller Name Jaymes Graff Relationship To Patient Provider Is this call to report lab results? No Return Phone Number Please choose phone number Initial Comment Caller States Jaymes Graff at [facility] at ph [phone], Pt has breathing problem Nurse Assessment Guidelines Guideline Title Affirmed Question Affirmed Notes Nurse Date/Time (Eastern Time) Disp. Time Eilene Ghazi Time) Disposition Final User 08/13/2015 2:59:12 PM Send to Dubberly, Kathy 08/13/2015 3:01:07 PM Paged On Call back to Call [place] 08/13/2015 3:04:11 PM Page Completed Yes Honor Loh After Care Instructions Given Call Event Type User Date / Time Description Paging DoctorName Phone DateTime Result/Outcome Message Type Notes Viviana Simpler [phone] 08/13/2015 3:01:07 PM Paged On Call Back to Call Center Doctor Paged Please call Quillian Quince at [phone] Viviana Simpler 08/13/2015 3:04:03 PM Spoke with On Call - General Message Result

## 2015-08-15 ENCOUNTER — Inpatient Hospital Stay: Payer: Medicare Other

## 2015-08-15 LAB — BASIC METABOLIC PANEL
Anion gap: 7 (ref 5–15)
BUN: 28 mg/dL — ABNORMAL HIGH (ref 6–20)
CHLORIDE: 106 mmol/L (ref 101–111)
CO2: 30 mmol/L (ref 22–32)
Calcium: 10.2 mg/dL (ref 8.9–10.3)
Creatinine, Ser: 1.04 mg/dL — ABNORMAL HIGH (ref 0.44–1.00)
GFR calc non Af Amer: 50 mL/min — ABNORMAL LOW (ref >60)
GFR, EST AFRICAN AMERICAN: 59 mL/min — AB (ref >60)
Glucose, Bld: 145 mg/dL — ABNORMAL HIGH (ref 65–99)
POTASSIUM: 3.9 mmol/L (ref 3.5–5.1)
SODIUM: 143 mmol/L (ref 135–145)

## 2015-08-15 LAB — CBC WITH DIFFERENTIAL/PLATELET
BASOS PCT: 0 %
Basophils Absolute: 0 10*3/uL (ref 0–0.1)
EOS ABS: 0 10*3/uL (ref 0–0.7)
Eosinophils Relative: 0 %
HEMATOCRIT: 39.6 % (ref 35.0–47.0)
HEMOGLOBIN: 13 g/dL (ref 12.0–16.0)
LYMPHS ABS: 1 10*3/uL (ref 1.0–3.6)
Lymphocytes Relative: 6 %
MCH: 28.3 pg (ref 26.0–34.0)
MCHC: 32.8 g/dL (ref 32.0–36.0)
MCV: 86.2 fL (ref 80.0–100.0)
Monocytes Absolute: 1.3 10*3/uL — ABNORMAL HIGH (ref 0.2–0.9)
Monocytes Relative: 7 %
NEUTROS ABS: 15.6 10*3/uL — AB (ref 1.4–6.5)
NEUTROS PCT: 87 %
Platelets: 177 10*3/uL (ref 150–440)
RBC: 4.59 MIL/uL (ref 3.80–5.20)
RDW: 14.1 % (ref 11.5–14.5)
WBC: 17.9 10*3/uL — AB (ref 3.6–11.0)

## 2015-08-15 LAB — GLUCOSE, CAPILLARY
GLUCOSE-CAPILLARY: 140 mg/dL — AB (ref 65–99)
Glucose-Capillary: 231 mg/dL — ABNORMAL HIGH (ref 65–99)
Glucose-Capillary: 162 mg/dL — ABNORMAL HIGH (ref 65–99)
Glucose-Capillary: 141 mg/dL — ABNORMAL HIGH (ref 65–99)

## 2015-08-15 LAB — HEMOGLOBIN A1C: Hgb A1c MFr Bld: 6.7 % — ABNORMAL HIGH (ref 4.0–6.0)

## 2015-08-15 LAB — TSH: TSH: 1.144 u[IU]/mL (ref 0.350–4.500)

## 2015-08-15 LAB — PROCALCITONIN: PROCALCITONIN: 7.09 ng/mL

## 2015-08-15 MED ORDER — LEVOFLOXACIN 750 MG PO TABS: 750.0000 mg | ORAL_TABLET | ORAL | Status: AC

## 2015-08-15 MED ORDER — LEVOFLOXACIN 500 MG PO TABS
750.0000 mg | ORAL_TABLET | ORAL | Status: DC
  Administered 2015-08-15: 750 mg via ORAL
  Filled 2015-08-15: qty 1

## 2015-08-15 MED ORDER — LEVOFLOXACIN 500 MG PO TABS: 500.0000 mg | ORAL_TABLET | Freq: Every day | ORAL | Status: DC

## 2015-08-15 NOTE — Care Management Important Message (Signed)
Important Message  Patient Details  Name: Elizabeth Farmer MRN: [id number] Date of Birth: 1936/10/15   Medicare Important Message Given:  Yes-second notification given    Juliann Pulse A Allmond 08/15/2015, 10:32 AM

## 2015-08-15 NOTE — Progress Notes (Signed)
ANTIBIOTIC CONSULT NOTE - INITIAL  Pharmacy Consult for Levaquin Indication: rule out pneumonia  Allergies  Allergen Reactions  . Aspirin     Other reaction(s): Unknown  . Fish-Derived Products Other (See Comments)    Unknown reaction-MAR  . Iodine   . Salicylates   . Sulfites   . Yellow Dyes (Non-Tartrazine) Other (See Comments)    Unknown reaction-MAR    Patient Measurements: Height: 5\' 2"  (157.5 cm) Weight: 158 lb 8.2 oz (71.9 kg) IBW/kg (Calculated) : 50.1  Vital Signs: Temp: 98.5 F (36.9 C) (09/13 0741) Temp Source: Axillary (09/13 0741) BP: 136/81 mmHg (09/13 0800) Pulse Rate: 67 (09/13 0800) Intake/Output from previous day: 09/12 0701 - 09/13 0700 In: 288.8 [I.V.:138.8; IV Piggyback:150] Out: -  Intake/Output from this shift: Total I/O In: 10 [I.V.:10] Out: -   Labs:  Recent Labs  08/13/15 1610 08/14/15 0430 08/15/15 0424  WBC 17.2* 22.6* 17.9*  HGB 15.3 13.5 13.0  PLT 167 162 177  CREATININE 1.11* 0.93 1.04*   Estimated Creatinine Clearance: 42.1 mL/min (by C-G formula based on Cr of 1.04). No results for input(s): VANCOTROUGH, VANCOPEAK, VANCORANDOM, GENTTROUGH, GENTPEAK, GENTRANDOM, TOBRATROUGH, TOBRAPEAK, TOBRARND, AMIKACINPEAK, AMIKACINTROU, AMIKACIN in the last 72 hours.   Microbiology: Recent Results (from the past 720 hour(s))  MRSA PCR Screening     Status: None   Collection Time: 08/12/15  8:05 PM  Result Value Ref Range Status   MRSA by PCR NEGATIVE NEGATIVE Final    Comment:        The GeneXpert MRSA Assay (FDA approved for NASAL specimens only), is one component of a comprehensive MRSA colonization surveillance program. It is not intended to diagnose MRSA infection nor to guide or monitor treatment for MRSA infections.     Medical History: Past Medical History  Diagnosis Date  . COPD (chronic obstructive pulmonary disease)   . CHF (congestive heart failure)   . HTN (hypertension)   . Dementia   . Parkinson disease    . Breast cancer     Medications:  Scheduled:  . antiseptic oral rinse  7 mL Mouth Rinse q12n4p  . arformoterol  15 mcg Nebulization BID  . budesonide  0.25 mg Nebulization BID  . chlorhexidine  15 mL Mouth Rinse BID  . furosemide  20 mg Oral BH-q7a  . insulin aspart  0-5 Units Subcutaneous QHS  . insulin aspart  0-9 Units Subcutaneous TID WC  . letrozole  2.5 mg Oral QHS  . levofloxacin  750 mg Oral Q48H  . losartan  100 mg Oral BH-q7a  . [START ON 08/29/2015] Vitamin D (Ergocalciferol)  50,000 Units Oral Q30 days   Infusions:   PRN: acetaminophen **OR** [DISCONTINUED] acetaminophen, [DISCONTINUED] ondansetron **OR** ondansetron (ZOFRAN) IV  Assessment: 79 y/o F admitted from LTCF with acute respiratory failure.   Plan:  MD changed Levaquin to 500 mg po daily. Will adjust dosing to 750 mg oral q 48 hours for renal function.   Ulice Dash D 08/15/2015,11:00 AM

## 2015-08-15 NOTE — Clinical Social Work Note (Signed)
Clinical Social Work Assessment  Patient Details  Name: Elizabeth Farmer MRN: [id number] Date of Birth: 09-13-1936  Date of referral:  08/13/15               Reason for consult:  Discharge Planning, Facility Placement                Permission sought to share information with:    Permission granted to share information::     Name::        Agency::     Relationship::     Contact Information:     Housing/Transportation Living arrangements for the past 2 months:  [place] of Information:  Facility Patient Interpreter Needed:  None Criminal Activity/Legal Involvement Pertinent to Current Situation/Hospitalization:  No - Comment as needed Significant Relationships:  Siblings Lives with:  Facility Resident Do you feel safe going back to the place where you live?  Yes Need for family participation in patient care:  No (Coment)  Care giving concerns:  Pt is bed bound and requires total care.   Social Worker assessment / plan:  Pt is currently disoriented.  CSW called pt's sister who is pt's primary family support and left a message.  CSW spoke to staff at [facility] where pt is a long term care pt.  Pt is able to return to the facility when medically stable.  CSW completed FL2 and placed on the chart.  Employment status:  Retired Advertising copywriter, [employer] PT Recommendations:  Not assessed at this time Information / Referral to community resources:  [place]  Patient/Family's Response to care: Pt's sister has been supportive.  Patient/Family's Understanding of and Emotional Response to Diagnosis, Current Treatment, and Prognosis:  Pt's sister understands pt's medical needs and continues to be supportive.  Emotional Assessment Appearance:  Appears stated age Attitude/Demeanor/Rapport:  Other (Oriented to self only) Affect (typically observed):  Pleasant Orientation:  Oriented to Self Alcohol / Substance use:  Not  Applicable Psych involvement (Current and /or in the community):  No (Comment)  Discharge Needs  Concerns to be addressed:  Discharge Planning Concerns Readmission within the last 30 days:  No Current discharge risk:  Physical Impairment Barriers to Discharge:  No Barriers Identified   Naida Sleight, LCSW 08/15/2015, 10:55 AM

## 2015-08-15 NOTE — Progress Notes (Signed)
Initial Nutrition Assessment   INTERVENTION:   Meals and Snacks: Cater to patient preferences, recommend removal of diabetic diet as pt does not have hx of DM, only on sliding scale insulin  NUTRITION DIAGNOSIS:   No nutrition diagnosis at this time  GOAL:   Patient will meet greater than or equal to 90% of their needs   MONITOR:    (Energy Intake, Anthropometrics, Digestive System, Electrolyte.Renal Profile, Glucose Profile)  REASON FOR ASSESSMENT:   Malnutrition Screening Tool    ASSESSMENT:    Pt admitted with respiratory distressed after vomitting, possible aspiration. Initially on Bipap,  Currently on [place]. Pt with advanced dementia  Past Medical History  Diagnosis Date  . COPD (chronic obstructive pulmonary disease)   . CHF (congestive heart failure)   . HTN (hypertension)   . Dementia   . Parkinson disease   . Breast cancer     Diet Order:  Diet heart healthy/carb modified Room service appropriate?: Yes with Assist; Fluid consistency:: Thin   Energy Intake: no intake yet as diet just advanced  Food and Nutrition related history: Sister at bedside and reports she recently met with staff at SNF and they report that pt has been eating well, approximately 75% of meals. Pt with poor intake the day of admission. Pt does not use nutritional supplements  Skin:  Reviewed, no issues   Electrolyte and Renal Profile:  Recent Labs Lab 08/13/15 1610 08/14/15 0430 08/15/15 0424  BUN 17 20 28*  CREATININE 1.11* 0.93 1.04*  NA 138 141 143  K 4.1 3.7 3.9   Glucose Profile:  Recent Labs  08/14/15 2051 08/15/15 0738 08/15/15 1139  GLUCAP 162* 140* 141*   Protein Profile:  Recent Labs Lab 08/13/15 1610  ALBUMIN 4.7   Meds: ss novolog, lasix  Height:   Ht Readings from Last 1 Encounters:  08/13/15 5' 2"  (1.575 m)    Weight: Sister reports pt weight has been stable  Wt Readings from Last 1 Encounters:  08/13/15 158 lb 8.2 oz (71.9 kg)    BMI:  Body  mass index is 28.98 kg/(m^2).   LOW Care Level  Kerman Passey MS, [place], LDN [phone] Pager

## 2015-08-15 NOTE — Progress Notes (Signed)
Patient discharged to [facility] via ambulance. Report was called to Doristine Devoid nurse at facility this afternoon.

## 2015-08-15 NOTE — Discharge Summary (Signed)
[facility] [place] NAME: Elizabeth Farmer    MR#:  [id number]  DATE OF BIRTH:  10/03/1936  DATE OF ADMISSION:  08/13/2015 ADMITTING PHYSICIAN: Dustin Flock, MD  DATE OF DISCHARGE: 08/15/2015  PRIMARY CARE PHYSICIAN: No primary care provider on file.    ADMISSION DIAGNOSIS:  Tachycardia [R00.0] COPD exacerbation [J44.1]  DISCHARGE DIAGNOSIS:  Acute hypoxic respiratory failure secondary to pneumonia suspected aspiration  SECONDARY DIAGNOSIS:   Past Medical History  Diagnosis Date  . COPD (chronic obstructive pulmonary disease)   . CHF (congestive heart failure)   . HTN (hypertension)   . Dementia   . Parkinson disease   . Breast cancer     HOSPITAL COURSE:   Patient is a 79 year old white female is being brought in with acute respiratory failure. Please review history and physical for details  1. Acute respiratory failure with hypoxia: due to COPD flare n  Pneumonia probably aspiration is a possibility with her having nausea and vomiting yesterday  CTA chest is negative for pulmonary embolism Continue on breathing treatments Continue BiPAP daily at bedtime, if patient could not tolerate BiPAP mask continue nocturnal oxygen via nasal cannula Ciontinue on by mouth Levaquin for next and 10 days Repeat chest x-ray with no acute abnormalities Appreciate pulmonology recommendations  2. History of CHF type unknown: Early compensated monitor respiratory status continue Lasix  3. History of breast cancer continue Femara  4. Chronic history of diabetes mellitus Not on any home medications Follow-up by primary care  5. Chronic history of dementia  6. Miscellaneous: Lovenox for DVT prophylaxis  DISCHARGE CONDITIONS:   fair  CONSULTS OBTAINED:  Treatment Team:  Dustin Flock, MD Wilhelmina Mcardle, MD   PROCEDURES bipap  DRUG ALLERGIES:   Allergies  Allergen Reactions  . Aspirin     Other reaction(s):  Unknown  . Fish-Derived Products Other (See Comments)    Unknown reaction-MAR  . Iodine   . Salicylates   . Sulfites   . Yellow Dyes (Non-Tartrazine) Other (See Comments)    Unknown reaction-MAR    DISCHARGE MEDICATIONS:   Current Discharge Medication List    CONTINUE these medications which have NOT CHANGED   Details  acetaminophen (TYLENOL) 500 MG tablet Take 500 mg by mouth every 6 (six) hours as needed for mild pain, moderate pain or fever. Do not exceed 4 gm apap/day from all sources.    albuterol (PROVENTIL HFA;VENTOLIN HFA) 108 (90 BASE) MCG/ACT inhaler Inhale 2 puffs into the lungs every 4 (four) hours as needed for wheezing.    Cholecalciferol (VITAMIN D3) 50000 UNITS CAPS Take 50,000 Units by mouth every 30 (thirty) days. Take on the 27th.    Fluticasone-Salmeterol (ADVAIR) 250-50 MCG/DOSE AEPB Inhale 1 puff into the lungs 2 (two) times daily.    furosemide (LASIX) 20 MG tablet Take 20 mg by mouth every morning.    letrozole (FEMARA) 2.5 MG tablet Take 2.5 mg by mouth at bedtime.    losartan (COZAAR) 100 MG tablet Take 100 mg by mouth every morning.    POLYETHYLENE GLYCOL 3350 PO Take 17 g by mouth daily. Mix in [address] of fluid. *use large size*    potassium chloride (K-DUR,KLOR-CON) 10 MEQ tablet Take 10 mEq by mouth 2 (two) times daily.    senna-docusate (SENOKOT S) 8.6-50 MG per tablet Take 2 tablets by mouth daily as needed for mild constipation or moderate constipation.    Umeclidinium Bromide (INCRUSE ELLIPTA) 62.5 MCG/INH AEPB Inhale  1 puff into the lungs daily.         DISCHARGE INSTRUCTIONS:  If she will tolerate, recommend nocturnal BiPAP with settings of 10/5 and 2 lpm O2  If she doesn't tolerate nocturnal BiPAP, continue nocturnal O2 as previously Follow-up with primary care physician in 3 days  DIET:  Low fat, Low cholesterol diet  DISCHARGE CONDITION:  Fair  ACTIVITY:  Activity as tolerated  OXYGEN:  Home Oxygen: Yes.     Oxygen  Delivery: 3 liters/min via nasal cannula Nocturnal BiPAP daily at bedtime as tolerated with settings 10 over 5  DISCHARGE LOCATION:  Skilled nursing facility  If you experience worsening of your admission symptoms, develop shortness of breath, life threatening emergency, suicidal or homicidal thoughts you must seek medical attention immediately by calling 911 or calling your MD immediately  if symptoms less severe.  You Must read complete instructions/literature along with all the possible adverse reactions/side effects for all the Medicines you take and that have been prescribed to you. Take any new Medicines after you have completely understood and accpet all the possible adverse reactions/side effects.   Please note  You were cared for by a hospitalist during your hospital stay. If you have any questions about your discharge medications or the care you received while you were in the hospital after you are discharged, you can call the unit and asked to speak with the hospitalist on call if the hospitalist that took care of you is not available. Once you are discharged, your primary care physician will handle any further medical issues. Please note that NO REFILLS for any discharge medications will be authorized once you are discharged, as it is imperative that you return to your primary care physician (or establish a relationship with a primary care physician if you do not have one) for your aftercare needs so that they can reassess your need for medications and monitor your lab values.     Today  Chief Complaint  Patient presents with  . Respiratory Distress   Patient is doing much better. Seems to be at her baseline. Talking in small  words  ROS: Unobtainable completely but patient states she is okay   VITAL SIGNS:  Blood pressure 136/81, pulse 67, temperature 98.5 F (36.9 C), temperature source Axillary, resp. rate 21, height 5\' 2"  (1.575 m), weight 71.9 kg (158 lb 8.2 oz), SpO2 99  %.  I/O:   Intake/Output Summary (Last 24 hours) at 08/15/15 1213 Last data filed at 08/15/15 0800  Gross per 24 hour  Intake 298.83 ml  Output      0 ml  Net 298.83 ml    PHYSICAL EXAMINATION:  GENERAL:  79 y.o.-year-old patient lying in the bed with no acute distress.  EYES: Pupils equal, round, reactive to light and accommodation. No scleral icterus.   HEENT: Head atraumatic, normocephalic. Oropharynx and nasopharynx clear.  NECK:  Supple, no jugular venous distention. No thyroid enlargement, no tenderness.  LUNGS: Normal breath sounds bilaterally, no wheezing, rales,rhonchi or crepitation. No use of accessory muscles of respiration.  CARDIOVASCULAR: S1, S2 normal. No murmurs, rubs, or gallops.  ABDOMEN: Soft, non-tender, non-distended. Bowel sounds present. No organomegaly or mass.  EXTREMITIES: No pedal edema, cyanosis, or clubbing.  NEUROLOGIC: The patient is alert and oriented, bed bound, communicating in small words PSYCHIATRIC: The patient is alert and oriented SKIN: No obvious rash, lesion, or ulcer.   DATA REVIEW:   CBC  Recent Labs Lab 08/15/15 0424  WBC 17.9*  HGB 13.0  HCT 39.6  PLT 177    Chemistries   Recent Labs Lab 08/13/15 1610  08/15/15 0424  NA 138  < > 143  K 4.1  < > 3.9  CL 100*  < > 106  CO2 26  < > 30  GLUCOSE 159*  < > 145*  BUN 17  < > 28*  CREATININE 1.11*  < > 1.04*  CALCIUM 10.3  < > 10.2  AST 44*  --   --   ALT 27  --   --   ALKPHOS 101  --   --   BILITOT 1.2  --   --   < > = values in this interval not displayed.  Cardiac Enzymes  Recent Labs Lab 08/13/15 1610  TROPONINI <0.03    Microbiology Results  Results for orders placed or performed during the hospital encounter of 08/13/15  MRSA PCR Screening     Status: None   Collection Time: 08/12/15  8:05 PM  Result Value Ref Range Status   MRSA by PCR NEGATIVE NEGATIVE Final    Comment:        The GeneXpert MRSA Assay (FDA approved for NASAL specimens only), is  one component of a comprehensive MRSA colonization surveillance program. It is not intended to diagnose MRSA infection nor to guide or monitor treatment for MRSA infections.     RADIOLOGY:  Dg Chest 1 View  08/13/2015   CLINICAL DATA:  Shortness of breath.  EXAM: CHEST  1 VIEW  COMPARISON:  03/21/2015  FINDINGS: Normal heart size. Aortic atherosclerosis noted. There is asymmetric elevation of the right hemidiaphragm. No pleural effusion identified. No airspace consolidation.  IMPRESSION: 1. Low lung volumes. 2. Asymmetric elevation of right hemidiaphragm. 3. Aortic atherosclerosis   Electronically Signed   By: Kerby Moors M.D.   On: 08/13/2015 16:39   Ct Angio Chest Pe W/cm &/or Wo Cm  08/14/2015   CLINICAL DATA:  79 year old female with respiratory distress following an episode of nausea and vomiting. Medical history includes COPD, congestive heart failure and dementia. History of iodine allergy. Patient pre-medicated prior to administration of intravenous contrast.  EXAM: CT ANGIOGRAPHY CHEST WITH CONTRAST  TECHNIQUE: Multidetector CT imaging of the chest was performed using the standard protocol during bolus administration of intravenous contrast. Multiplanar CT image reconstructions and MIPs were obtained to evaluate the vascular anatomy.  CONTRAST:  29mL OMNIPAQUE IOHEXOL 350 MG/ML SOLN  COMPARISON:  Chest x-ray 08/13/2015; prior CT scan of the chest 02/26/2012  FINDINGS: Mediastinum: Unremarkable CT appearance of the thyroid gland. No suspicious mediastinal or hilar adenopathy. No soft tissue mediastinal mass. Small hiatal hernia.  Heart/Vascular: Conventional 3 vessel aortic arch morphology. Mild aneurysmal dilatation of the ascending thoracic aorta with a maximal diameter of 4.1 cm. The transverse and descending thoracic aorta are normal in caliber. There is adequate opacification of the pulmonary arteries to the proximal subsegmental level. However, evaluation beyond the segmental level is  limited by respiratory motion artifact. No evidence of central filling defect to suggest acute pulmonary embolus. The main pulmonary artery is within normal limits for size. The heart is within normal limits for size. No pericardial effusion.  Lungs/Pleura: Moderate respiratory motion artifact limits evaluation for small pulmonary nodules. Focus of a micro nodularity and patchy ground-glass attenuation opacity in the right lung apex. The larger nodular opacities are unchanged dating back to 2013. Additional scattered pulmonary nodules are also stable dating back to 2013 consistent with a benign and likely  granulomatous etiology. The largest individual nodule again measures 10 mm in the right middle lobe. There is associated adjacent linear pleural parenchymal scarring. Mild bronchial wall thickening. Dependent atelectasis in the lower lobes.  Bones/Soft Tissues: No acute fracture or aggressive appearing lytic or blastic osseous lesion. Surgical changes of prior right mastectomy.  Upper Abdomen: 5- 6 mm stone in the upper pole collecting system of the left kidney is incompletely imaged. The liver is diffusely low in attenuation consistent with hepatic steatosis. There is evidence of sparing around the gallbladder fossa. Otherwise, unremarkable imaged upper abdomen.  Review of the MIP images confirms the above findings.  IMPRESSION: 1. Moderate respiratory motion artifact limits evaluation of the smaller pulmonary arteries. Within these limitations, there is no evidence of acute pulmonary embolus. 2. Low inspiratory volumes with bibasilar atelectasis, lower lobe bronchial wall thickening and very mild patchy ground-glass attenuation opacity. Given the reported clinical history of respiratory distress following an episode of emesis, small volume aspiration is a consideration. 3. Stable micro and macro nodularity with adjacent pleural-parenchymal scarring throughout the right lung dating back to 2013 consistent with  sequelae of old granulomatous disease. 4. Mild aneurysmal dilatation of the ascending thoracic aorta with a maximal diameter of 4.1 cm. Recommend annual imaging followup by CTA or MRA. This recommendation follows 2010 ACCF/AHA/AATS/ACR/ASA/SCA/SCAI/SIR/STS/SVM Guidelines for the Diagnosis and Management of Patients with Thoracic Aortic Disease. Circulation. 2010; 121: e266-e369 5. Small hiatal hernia. 6. Hepatic steatosis. 7. Incompletely imaged 5- 6 mm stone in the upper pole collecting system of the left kidney.   Electronically Signed   By: Jacqulynn Cadet M.D.   On: 08/14/2015 08:24   Dg Chest Port 1 View  08/15/2015   CLINICAL DATA:  Respiratory failure.  EXAM: PORTABLE CHEST - 1 VIEW  COMPARISON:  August 13, 2015.  FINDINGS: The heart size and mediastinal contours are within normal limits. Both lungs are clear. Hypoinflation of the lungs is noted. No pneumothorax or pleural effusion is noted. The visualized skeletal structures are unremarkable.  IMPRESSION: No acute cardiopulmonary abnormality seen.   Electronically Signed   By: Marijo Conception, M.D.   On: 08/15/2015 11:49    EKG:   Orders placed or performed during the hospital encounter of 08/13/15  . EKG 12-Lead  . EKG 12-Lead      Management plans discussed with the patient, family and they are in agreement.  CODE STATUS:     Code Status Orders        Start     Ordered   08/13/15 2002  Do not attempt resuscitation (DNR)   Continuous    Question Answer Comment  In the event of cardiac or respiratory ARREST Do not call a "code blue"   In the event of cardiac or respiratory ARREST Do not perform Intubation, CPR, defibrillation or ACLS   In the event of cardiac or respiratory ARREST Use medication by any route, position, wound care, and other measures to relive pain and suffering. May use oxygen, suction and manual treatment of airway obstruction as needed for comfort.      08/13/15 2001    Advance Directive Documentation         Most Recent Value   Type of Advance Directive  Out of facility DNR (pink MOST or yellow form)   Pre-existing out of facility DNR order (yellow form or pink MOST form)     "MOST" Form in Place?        TOTAL TIME TAKING CARE OF  THIS PATIENT: 45 minutes.  Transfer patient to nursing home   @[facility] @  on 08/15/2015 at 12:13 PM  Between 7am to 6pm - Pager - [phone]  After 6pm go to [email or web address] - password [email or web address] [facility]  [facility]  Office  [phone]  CC: Primary care physician; No primary care provider on file.

## 2015-08-15 NOTE — Evaluation (Signed)
Clinical/Bedside Swallow Evaluation Patient Details  Name: ROBBIE NANGLE MRN: [id number] Date of Birth: 01/11/1936  Today's Date: 08/15/2015 Time: SLP Start Time (ACUTE ONLY): 1400 SLP Stop Time (ACUTE ONLY): 1500 SLP Time Calculation (min) (ACUTE ONLY): 60 min  Past Medical History:  Past Medical History  Diagnosis Date  . COPD (chronic obstructive pulmonary disease)   . CHF (congestive heart failure)   . HTN (hypertension)   . Dementia   . Parkinson disease   . Breast cancer    Past Surgical History:  Past Surgical History  Procedure Laterality Date  . Breast suergy    . Hip fracture surgery     HPI:  Aneya Daddona is a 79 y.o. female with a known history of COPD, congestive heart failure, dementia, early Parkinson's disease, hypertension who currently resides at the [facility] [place] home who is bedbound who earlier today was having nausea and vomiting then subsequently was noted to have respiratory distress by her sister. EMS was called. Patient was noted to have respiratory difficulties and had to be placed on BiPAP. She is been on BiPAP since being in the ER. She has dementia and unable to give any review of systems. Her sister is at bedside. She reports that patient usually is not able to communicate much. And is wheelchair bound. Pt had been NPO due to respiratory decline requiring BiPAP.  Today Pt was weaned from BiPAP and placed on regular diet by MD.  NSG reported to SLP that patient ate quickly and was pocketing food and required cuing to slow down and swallow.  Also that pt. began gagging on an apple and coughed it back up.  NSG reported that she appeared to be impulsive with her eating.    Assessment / Plan / Recommendation Clinical Impression  Pt. appears to present with mild oral phase dysphagia c/b decreased oral awareness (especially when self-feeding). Suspected due to baseline dementia/cognitive decline. When patient was not given verbal cues, she tended to eat  quickly and did not clear her mouth completely between bites.  Given verbal cues, Pt. did slow down slightly (but required continually cuing as carryover was not consistent). No pharyngeal phase deficits apparent during these trials or thin liquids and soft solids when following aspiration precautions. Pt could benefit from modified diet of mechanical soft consistency, thin liquids with monitoring at meals and verbal cuing and aspirations precautions in place.  NSG is giving meds crushed in apple sauce as able for safer swallowing. q    Aspiration Risk  Mild (based on cognitive status )    Diet Recommendation Dysphagia 3 (Mech soft);Thin   Medication Administration: Crushed with puree (as able) Compensations: Small sips/bites;Slow rate;Follow solids with liquid (Use liquids after PO intake to clear mouth of any residue)    Other  Recommendations Oral Care Recommendations: Staff/trained caregiver to provide oral care;Oral care BID   Follow Up Recommendations       Frequency and Duration min 2x/week  1 week   Pertinent Vitals/Pain None noted during session    SLP Swallow Goals  See plan of care   Swallow Study Prior Functional Status    Pt has baseline dementia and Parkinson's Disease per chart; assistance with ADLs.    General Date of Onset: 08/13/15 Other Pertinent Information: Altheria Shadoan is a 79 y.o. female with a known history of COPD, congestive heart failure, dementia, early Parkinson's disease, hypertension who currently resides at the twin [place] home who is bedbound who earlier today was  having nausea and vomiting then subsequently was noted to have respiratory distress by her sister. EMS was called. Patient was noted to have respiratory difficulties and had to be placed on BiPAP. She is been on BiPAP since being in the ER. She has dementia and unable to give any review of systems. Her sister is at bedside. She reports that patient usually is not able to communicate  much. And is wheelchair bound. Pt had been NPO due to respiratory decline requiring BiPAP.  Today Pt was weaned from BiPAP and placed on regular diet by MD.  NSG reported to SLP that patient ate quickly and was pocketing food and required cuing to slow down and swallow.  Also that pt. began gagging on an apple and coughed it back up.  NSG reported that she appeared to be impulsive with her eating.  Type of Study: Bedside swallow evaluation Diet Prior to this Study: Regular Temperature Spikes Noted: No Respiratory Status: Room air History of Recent Intubation: No (Weaned from BiPAP on 08/15/15) Behavior/Cognition: Alert;Cooperative;Distractible;Requires cueing Oral Cavity - Dentition: Adequate natural dentition/normal for age Self-Feeding Abilities: Needs assist;Able to feed self;Needs set up Patient Positioning: Upright in bed Baseline Vocal Quality: Low vocal intensity (Quivery at baseline; no change after PO trials) Volitional Cough:  (NT) Volitional Swallow:  (NT)    Oral/Motor/Sensory Function Overall Oral Motor/Sensory Function: Appears within functional limits for tasks assessed Labial ROM:  (NT) Labial Symmetry: Within Functional Limits Labial Strength:  (NT) Labial Sensation:  (NT) Lingual ROM: Within Functional Limits Lingual Symmetry: Within Functional Limits Lingual Strength:  (NT) Lingual Sensation:  (NT) Facial ROM:  (Nt) Facial Symmetry: Within Functional Limits Facial Strength:  (NT) Facial Sensation:  (NT) Velum:  (NT) Mandible: Within Functional Limits   Ice Chips Ice chips: Within functional limits Presentation: Spoon   Thin Liquid Thin Liquid: Within functional limits Presentation: Cup;Straw Other Comments: Pt. drank appx. 2 ounces water from cup and another 2-4 ounces by straw. No immediate/overt s/s of aspiration noted on these trials.    Nectar Thick Nectar Thick Liquid: Not tested   Honey Thick Honey Thick Liquid: Not tested   Puree Puree:  Impaired Presentation: Spoon Oral Phase Impairments: Poor awareness of bolus (Pt did not clear bolus before taking next bite) Oral Phase Functional Implications: Oral residue;Oral holding (NSG observed oral pocketing during lunch meal) Other Comments: Respiration rate increased with puree (and solid) trials, as patient ate quickly and required cuing to slow down, clear bolus and then take next bite. Exhibited oral phase dysphagia suspected due to cognitive status (impulse control, fast rate in eating, poor carryover of given cues).   Solid   GO    Solid: Impaired Presentation: Spoon;Self Fed Oral Phase Impairments: Poor awareness of bolus Oral Phase Functional Implications: Oral residue;Oral holding Other Comments: Respiratory rate increased with PO trials. NSG observed oral pocketing during lunch meal; SLP observed residue and poor clearing between bites (when not explicitly given verbal cues).       Lyriq Finerty 08/15/2015,3:08 PM

## 2015-08-15 NOTE — Progress Notes (Signed)
Much more alert No distress No complaints  Filed Vitals:   08/15/15 0600 08/15/15 0700 08/15/15 0741 08/15/15 0800  BP: 137/80 136/81  136/81  Pulse: 79 75  67  Temp:   98.5 F (36.9 C)   TempSrc:   Axillary   Resp: 25 24  21   Height:      Weight:      SpO2: 94% 95%  99%   NAD HEENT WNL No JVD noted Clear anteriorly Reg, no M NABS No edema  BMP Latest Ref Rng 08/15/2015 08/14/2015 08/13/2015  Glucose 65 - 99 mg/dL 145(H) 242(H) 159(H)  BUN 6 - 20 mg/dL 28(H) 20 17  Creatinine 0.44 - 1.00 mg/dL 1.04(H) 0.93 1.11(H)  Sodium 135 - 145 mmol/L 143 141 138  Potassium 3.5 - 5.1 mmol/L 3.9 3.7 4.1  Chloride 101 - 111 mmol/L 106 106 100(L)  CO2 22 - 32 mmol/L 30 26 26   Calcium 8.9 - 10.3 mg/dL 10.2 10.4(H) 10.3    CBC Latest Ref Rng 08/15/2015 08/14/2015 08/13/2015  WBC 3.6 - 11.0 K/uL 17.9(H) 22.6(H) 17.2(H)  Hemoglobin 12.0 - 16.0 g/dL 13.0 13.5 15.3  Hematocrit 35.0 - 47.0 % 39.6 40.9 46.9  Platelets 150 - 440 K/uL 177 162 167   PCT: 7.09 TSH: 1.144  No new CXR    IMPRESSION: Resp distress, resolved Elevated PCT, suspected aspiration OSA  PLAN/REC: Recheck CXR today and in a week Changed Levofloxacin to PO - complete 10 days If she will tolerate, recommend nocturnal BiPAP with settings of 10/5 and 2 lpm O2 bleed in If she doesn't tolerate nocturnal BiPAP, continue nocturnal O2 as previously OK for DC back to NH from PCCM perspective Discussed with Dr Margaretmary Eddy  Merton Border, MD PCCM service Mobile [phone] Pager [phone]

## 2015-08-15 NOTE — Clinical Social Work Note (Signed)
CSW spoke to pt's sister by phone.  Pt's sister explained that pt is widowed and does not have children.    CSW informed sister that pt is ready for discharge and will be returning to [facility] this afternoon.  Pt's sister stated she would meet her sister at the facility.  CSW informed staff at [facility] that pt is ready for discharge.  CSW sent d/c documentation to the facility.  CSW provided facility information to RN in order to call report.  Pt will be transported via EMS.    CSW signing off as there are no further needs at this time.  [place], [place]

## 2015-08-16 DIAGNOSIS — J690 Pneumonitis due to inhalation of food and vomit: Secondary | ICD-10-CM

## 2015-10-23 DIAGNOSIS — J439 Emphysema, unspecified: Secondary | ICD-10-CM | POA: Diagnosis not present

## 2015-10-23 DIAGNOSIS — C50919 Malignant neoplasm of unspecified site of unspecified female breast: Secondary | ICD-10-CM

## 2015-10-23 DIAGNOSIS — I10 Essential (primary) hypertension: Secondary | ICD-10-CM | POA: Diagnosis not present

## 2015-10-23 DIAGNOSIS — G301 Alzheimer's disease with late onset: Secondary | ICD-10-CM | POA: Diagnosis not present

## 2015-10-23 DIAGNOSIS — J9601 Acute respiratory failure with hypoxia: Secondary | ICD-10-CM

## 2015-11-17 ENCOUNTER — Emergency Department: Payer: Medicare Other

## 2015-11-17 ENCOUNTER — Encounter: Payer: Self-pay | Admitting: *Deleted

## 2015-11-17 ENCOUNTER — Inpatient Hospital Stay
Admission: EM | Admit: 2015-11-17 | Discharge: 2015-12-03 | DRG: 871 | Disposition: E | Payer: Medicare Other | Attending: Internal Medicine | Admitting: Internal Medicine

## 2015-11-17 DIAGNOSIS — G20 Parkinson's disease: Secondary | ICD-10-CM | POA: Diagnosis present

## 2015-11-17 DIAGNOSIS — Z9981 Dependence on supplemental oxygen: Secondary | ICD-10-CM

## 2015-11-17 DIAGNOSIS — R34 Anuria and oliguria: Secondary | ICD-10-CM | POA: Diagnosis present

## 2015-11-17 DIAGNOSIS — A4159 Other Gram-negative sepsis: Secondary | ICD-10-CM | POA: Diagnosis not present

## 2015-11-17 DIAGNOSIS — N390 Urinary tract infection, site not specified: Secondary | ICD-10-CM | POA: Diagnosis not present

## 2015-11-17 DIAGNOSIS — Z8249 Family history of ischemic heart disease and other diseases of the circulatory system: Secondary | ICD-10-CM

## 2015-11-17 DIAGNOSIS — I959 Hypotension, unspecified: Secondary | ICD-10-CM | POA: Diagnosis present

## 2015-11-17 DIAGNOSIS — Z515 Encounter for palliative care: Secondary | ICD-10-CM | POA: Diagnosis present

## 2015-11-17 DIAGNOSIS — N289 Disorder of kidney and ureter, unspecified: Secondary | ICD-10-CM

## 2015-11-17 DIAGNOSIS — J189 Pneumonia, unspecified organism: Secondary | ICD-10-CM | POA: Diagnosis present

## 2015-11-17 DIAGNOSIS — E872 Acidosis: Secondary | ICD-10-CM | POA: Diagnosis present

## 2015-11-17 DIAGNOSIS — I872 Venous insufficiency (chronic) (peripheral): Secondary | ICD-10-CM | POA: Diagnosis present

## 2015-11-17 DIAGNOSIS — R0902 Hypoxemia: Secondary | ICD-10-CM | POA: Diagnosis present

## 2015-11-17 DIAGNOSIS — F0280 Dementia in other diseases classified elsewhere without behavioral disturbance: Secondary | ICD-10-CM | POA: Diagnosis present

## 2015-11-17 DIAGNOSIS — E870 Hyperosmolality and hypernatremia: Secondary | ICD-10-CM | POA: Diagnosis present

## 2015-11-17 DIAGNOSIS — Z66 Do not resuscitate: Secondary | ICD-10-CM | POA: Diagnosis present

## 2015-11-17 DIAGNOSIS — F419 Anxiety disorder, unspecified: Secondary | ICD-10-CM | POA: Diagnosis present

## 2015-11-17 DIAGNOSIS — A419 Sepsis, unspecified organism: Secondary | ICD-10-CM | POA: Diagnosis present

## 2015-11-17 DIAGNOSIS — G9341 Metabolic encephalopathy: Secondary | ICD-10-CM | POA: Diagnosis present

## 2015-11-17 DIAGNOSIS — Z9010 Acquired absence of unspecified breast and nipple: Secondary | ICD-10-CM

## 2015-11-17 DIAGNOSIS — Z853 Personal history of malignant neoplasm of breast: Secondary | ICD-10-CM | POA: Diagnosis not present

## 2015-11-17 DIAGNOSIS — I509 Heart failure, unspecified: Secondary | ICD-10-CM | POA: Diagnosis present

## 2015-11-17 DIAGNOSIS — Y95 Nosocomial condition: Secondary | ICD-10-CM | POA: Diagnosis present

## 2015-11-17 DIAGNOSIS — N183 Chronic kidney disease, stage 3 (moderate): Secondary | ICD-10-CM | POA: Diagnosis present

## 2015-11-17 DIAGNOSIS — B964 Proteus (mirabilis) (morganii) as the cause of diseases classified elsewhere: Secondary | ICD-10-CM | POA: Diagnosis present

## 2015-11-17 DIAGNOSIS — E860 Dehydration: Secondary | ICD-10-CM | POA: Diagnosis present

## 2015-11-17 DIAGNOSIS — J440 Chronic obstructive pulmonary disease with acute lower respiratory infection: Secondary | ICD-10-CM | POA: Diagnosis present

## 2015-11-17 DIAGNOSIS — J9811 Atelectasis: Secondary | ICD-10-CM | POA: Diagnosis present

## 2015-11-17 DIAGNOSIS — N170 Acute kidney failure with tubular necrosis: Secondary | ICD-10-CM | POA: Diagnosis present

## 2015-11-17 DIAGNOSIS — Z9889 Other specified postprocedural states: Secondary | ICD-10-CM | POA: Diagnosis not present

## 2015-11-17 DIAGNOSIS — Z803 Family history of malignant neoplasm of breast: Secondary | ICD-10-CM | POA: Diagnosis not present

## 2015-11-17 DIAGNOSIS — R7989 Other specified abnormal findings of blood chemistry: Secondary | ICD-10-CM | POA: Diagnosis present

## 2015-11-17 DIAGNOSIS — G4733 Obstructive sleep apnea (adult) (pediatric): Secondary | ICD-10-CM | POA: Diagnosis present

## 2015-11-17 DIAGNOSIS — I129 Hypertensive chronic kidney disease with stage 1 through stage 4 chronic kidney disease, or unspecified chronic kidney disease: Secondary | ICD-10-CM | POA: Diagnosis present

## 2015-11-17 DIAGNOSIS — Z993 Dependence on wheelchair: Secondary | ICD-10-CM | POA: Diagnosis not present

## 2015-11-17 DIAGNOSIS — G309 Alzheimer's disease, unspecified: Secondary | ICD-10-CM | POA: Diagnosis present

## 2015-11-17 DIAGNOSIS — Z888 Allergy status to other drugs, medicaments and biological substances status: Secondary | ICD-10-CM | POA: Diagnosis not present

## 2015-11-17 DIAGNOSIS — N179 Acute kidney failure, unspecified: Secondary | ICD-10-CM

## 2015-11-17 HISTORY — DX: Obstructive sleep apnea (adult) (pediatric): G47.33

## 2015-11-17 HISTORY — DX: Venous insufficiency (chronic) (peripheral): I87.2

## 2015-11-17 LAB — COMPREHENSIVE METABOLIC PANEL
ALBUMIN: 3.6 g/dL (ref 3.5–5.0)
ALK PHOS: 109 U/L (ref 38–126)
ALT: 32 U/L (ref 14–54)
AST: 63 U/L — AB (ref 15–41)
Anion gap: 17 — ABNORMAL HIGH (ref 5–15)
BILIRUBIN TOTAL: 0.8 mg/dL (ref 0.3–1.2)
BUN: 28 mg/dL — AB (ref 6–20)
CALCIUM: 10.7 mg/dL — AB (ref 8.9–10.3)
CO2: 20 mmol/L — ABNORMAL LOW (ref 22–32)
CREATININE: 2.35 mg/dL — AB (ref 0.44–1.00)
Chloride: 108 mmol/L (ref 101–111)
GFR calc Af Amer: 22 mL/min — ABNORMAL LOW (ref >60)
GFR, EST NON AFRICAN AMERICAN: 19 mL/min — AB (ref >60)
GLUCOSE: 224 mg/dL — AB (ref 65–99)
POTASSIUM: 4.2 mmol/L (ref 3.5–5.1)
Sodium: 145 mmol/L (ref 135–145)
TOTAL PROTEIN: 7.3 g/dL (ref 6.5–8.1)

## 2015-11-17 LAB — LACTIC ACID, PLASMA
LACTIC ACID, VENOUS: 6.9 mmol/L — AB (ref 0.5–2.0)
Lactic Acid, Venous: 7.6 mmol/L (ref 0.5–2.0)

## 2015-11-17 LAB — URINALYSIS COMPLETE WITH MICROSCOPIC (ARMC ONLY)
BILIRUBIN URINE: NEGATIVE
GLUCOSE, UA: NEGATIVE mg/dL
NITRITE: NEGATIVE
Protein, ur: 100 mg/dL — AB
SPECIFIC GRAVITY, URINE: 1.016 (ref 1.005–1.030)
pH: 6 (ref 5.0–8.0)

## 2015-11-17 LAB — TROPONIN I
TROPONIN I: 0.07 ng/mL — AB (ref <0.031)
TROPONIN I: 0.21 ng/mL — AB (ref <0.031)
Troponin I: 0.24 ng/mL — ABNORMAL HIGH (ref <0.031)

## 2015-11-17 LAB — CBC WITH DIFFERENTIAL/PLATELET
BASOS ABS: 0.2 10*3/uL — AB (ref 0–0.1)
Basophils Relative: 1 %
EOS ABS: 0 10*3/uL (ref 0–0.7)
EOS PCT: 0 %
HCT: 42 % (ref 35.0–47.0)
Hemoglobin: 13.5 g/dL (ref 12.0–16.0)
LYMPHS PCT: 1 %
Lymphs Abs: 0.4 10*3/uL — ABNORMAL LOW (ref 1.0–3.6)
MCH: 27.9 pg (ref 26.0–34.0)
MCHC: 32.1 g/dL (ref 32.0–36.0)
MCV: 87 fL (ref 80.0–100.0)
MONO ABS: 2 10*3/uL — AB (ref 0.2–0.9)
Monocytes Relative: 5 %
Neutro Abs: 37.5 10*3/uL — ABNORMAL HIGH (ref 1.4–6.5)
Neutrophils Relative %: 93 %
PLATELETS: 146 10*3/uL — AB (ref 150–440)
RBC: 4.83 MIL/uL (ref 3.80–5.20)
RDW: 14.5 % (ref 11.5–14.5)
WBC: 40.1 10*3/uL — ABNORMAL HIGH (ref 3.6–11.0)

## 2015-11-17 LAB — CBC
HCT: 38.8 % (ref 35.0–47.0)
HEMOGLOBIN: 12.5 g/dL (ref 12.0–16.0)
MCH: 28.4 pg (ref 26.0–34.0)
MCHC: 32.3 g/dL (ref 32.0–36.0)
MCV: 87.8 fL (ref 80.0–100.0)
PLATELETS: 87 10*3/uL — AB (ref 150–440)
RBC: 4.42 MIL/uL (ref 3.80–5.20)
RDW: 14.9 % — AB (ref 11.5–14.5)
WBC: 28.1 10*3/uL — ABNORMAL HIGH (ref 3.6–11.0)

## 2015-11-17 LAB — BASIC METABOLIC PANEL
Anion gap: 9 (ref 5–15)
BUN: 32 mg/dL — AB (ref 6–20)
CALCIUM: 9.2 mg/dL (ref 8.9–10.3)
CHLORIDE: 118 mmol/L — AB (ref 101–111)
CO2: 20 mmol/L — ABNORMAL LOW (ref 22–32)
CREATININE: 2.53 mg/dL — AB (ref 0.44–1.00)
GFR, EST AFRICAN AMERICAN: 20 mL/min — AB (ref >60)
GFR, EST NON AFRICAN AMERICAN: 17 mL/min — AB (ref >60)
Glucose, Bld: 115 mg/dL — ABNORMAL HIGH (ref 65–99)
Potassium: 3.6 mmol/L (ref 3.5–5.1)
SODIUM: 147 mmol/L — AB (ref 135–145)

## 2015-11-17 LAB — GLUCOSE, CAPILLARY: GLUCOSE-CAPILLARY: 126 mg/dL — AB (ref 65–99)

## 2015-11-17 LAB — BRAIN NATRIURETIC PEPTIDE: B NATRIURETIC PEPTIDE 5: 1165 pg/mL — AB (ref 0.0–100.0)

## 2015-11-17 LAB — MRSA PCR SCREENING: MRSA BY PCR: NEGATIVE

## 2015-11-17 MED ORDER — SENNOSIDES-DOCUSATE SODIUM 8.6-50 MG PO TABS: 2.0000 | ORAL_TABLET | Freq: Every day | ORAL | Status: DC | PRN

## 2015-11-17 MED ORDER — TIOTROPIUM BROMIDE MONOHYDRATE 18 MCG IN CAPS
18.0000 ug | ORAL_CAPSULE | Freq: Every day | RESPIRATORY_TRACT | Status: DC
  Administered 2015-11-17: 18 ug via RESPIRATORY_TRACT
  Filled 2015-11-17: qty 5

## 2015-11-17 MED ORDER — PIPERACILLIN-TAZOBACTAM 3.375 G IVPB
3.3750 g | Freq: Once | INTRAVENOUS | Status: AC
  Administered 2015-11-17: 3.375 g via INTRAVENOUS
  Filled 2015-11-17: qty 50

## 2015-11-17 MED ORDER — SODIUM CHLORIDE 0.9 % IV SOLN
INTRAVENOUS | Status: DC
Start: 2015-11-17 — End: 2015-11-18
  Administered 2015-11-17 (×2): via INTRAVENOUS

## 2015-11-17 MED ORDER — CETYLPYRIDINIUM CHLORIDE 0.05 % MT LIQD
7.0000 mL | Freq: Two times a day (BID) | OROMUCOSAL | Status: DC
  Administered 2015-11-17 – 2015-11-18 (×2): 7 mL via OROMUCOSAL

## 2015-11-17 MED ORDER — ONDANSETRON HCL 4 MG/2ML IJ SOLN: 4.0000 mg | Freq: Four times a day (QID) | INTRAMUSCULAR | Status: DC | PRN

## 2015-11-17 MED ORDER — VANCOMYCIN HCL 10 G IV SOLR
1250.0000 mg | Freq: Once | INTRAVENOUS | Status: AC
  Administered 2015-11-17: 1250 mg via INTRAVENOUS
  Filled 2015-11-17: qty 1250

## 2015-11-17 MED ORDER — METOPROLOL TARTRATE 1 MG/ML IV SOLN
2.5000 mg | INTRAVENOUS | Status: DC | PRN
  Administered 2015-11-17: 2.5 mg via INTRAVENOUS
  Filled 2015-11-17: qty 5

## 2015-11-17 MED ORDER — ACETAMINOPHEN 650 MG RE SUPP
650.0000 mg | RECTAL | Status: DC | PRN
  Administered 2015-11-17 – 2015-11-18 (×3): 650 mg via RECTAL
  Filled 2015-11-17 (×3): qty 1

## 2015-11-17 MED ORDER — LETROZOLE 2.5 MG PO TABS
2.5000 mg | ORAL_TABLET | Freq: Every day | ORAL | Status: DC
  Filled 2015-11-17 (×2): qty 1

## 2015-11-17 MED ORDER — UMECLIDINIUM BROMIDE 62.5 MCG/INH IN AEPB: 1.0000 | INHALATION_SPRAY | Freq: Every day | RESPIRATORY_TRACT | Status: DC

## 2015-11-17 MED ORDER — MOMETASONE FURO-FORMOTEROL FUM 100-5 MCG/ACT IN AERO
2.0000 | INHALATION_SPRAY | Freq: Two times a day (BID) | RESPIRATORY_TRACT | Status: DC
  Filled 2015-11-17: qty 8.8

## 2015-11-17 MED ORDER — SODIUM CHLORIDE 0.9 % IV BOLUS (SEPSIS)
1000.0000 mL | Freq: Once | INTRAVENOUS | Status: AC
  Administered 2015-11-17: 1000 mL via INTRAVENOUS

## 2015-11-17 MED ORDER — ALBUTEROL SULFATE (2.5 MG/3ML) 0.083% IN NEBU: 2.5000 mg | INHALATION_SOLUTION | RESPIRATORY_TRACT | Status: DC | PRN

## 2015-11-17 MED ORDER — ONDANSETRON HCL 4 MG PO TABS: 4.0000 mg | ORAL_TABLET | Freq: Four times a day (QID) | ORAL | Status: DC | PRN

## 2015-11-17 MED ORDER — DILTIAZEM HCL 25 MG/5ML IV SOLN
5.0000 mg | Freq: Once | INTRAVENOUS | Status: AC
  Administered 2015-11-17: 5 mg via INTRAVENOUS

## 2015-11-17 MED ORDER — PIPERACILLIN-TAZOBACTAM 3.375 G IVPB
3.3750 g | Freq: Two times a day (BID) | INTRAVENOUS | Status: DC
  Administered 2015-11-17 – 2015-11-18 (×2): 3.375 g via INTRAVENOUS
  Filled 2015-11-17 (×3): qty 50

## 2015-11-17 MED ORDER — ENOXAPARIN SODIUM 30 MG/0.3ML ~~LOC~~ SOLN
30.0000 mg | SUBCUTANEOUS | Status: DC
  Administered 2015-11-17: 30 mg via SUBCUTANEOUS
  Filled 2015-11-17: qty 0.3

## 2015-11-17 MED ORDER — DILTIAZEM HCL 25 MG/5ML IV SOLN
INTRAVENOUS | Status: AC
  Administered 2015-11-17: 5 mg via INTRAVENOUS
  Filled 2015-11-17: qty 5

## 2015-11-17 MED ORDER — SODIUM CHLORIDE 0.9 % IJ SOLN
3.0000 mL | Freq: Two times a day (BID) | INTRAMUSCULAR | Status: DC
  Administered 2015-11-18: 3 mL via INTRAVENOUS

## 2015-11-17 MED ORDER — VANCOMYCIN HCL IN DEXTROSE 750-5 MG/150ML-% IV SOLN
750.0000 mg | INTRAVENOUS | Status: DC
  Filled 2015-11-17: qty 150

## 2015-11-17 MED ORDER — SODIUM CHLORIDE 0.9 % IV BOLUS (SEPSIS): 500.0000 mL | Freq: Once | INTRAVENOUS | Status: DC

## 2015-11-17 MED ORDER — VITAMIN D3 1.25 MG (50000 UT) PO CAPS
50000.0000 [IU] | ORAL_CAPSULE | ORAL | Status: DC
  Filled 2015-11-17: qty 1

## 2015-11-17 MED ORDER — HALOPERIDOL LACTATE 5 MG/ML IJ SOLN: 1.0000 mg | Freq: Four times a day (QID) | INTRAMUSCULAR | Status: DC | PRN

## 2015-11-17 MED ORDER — ACETAMINOPHEN 500 MG PO TABS
500.0000 mg | ORAL_TABLET | Freq: Four times a day (QID) | ORAL | Status: DC | PRN
  Filled 2015-11-17: qty 1

## 2015-11-17 NOTE — Progress Notes (Signed)
Fever decreased to 99.0

## 2015-11-17 NOTE — ED Notes (Signed)
Resident of [facility], today developed sob, decrease alertmness, usually non verbal, has low grade fever

## 2015-11-17 NOTE — Progress Notes (Signed)
ANTICOAGULATION CONSULT NOTE - Initial Consult  Pharmacy Consult for Lovenox  Indication: VTE prophylaxis  Allergies  Allergen Reactions  . Aspirin     Other reaction(s): Unknown  . Fish-Derived Products Other (See Comments)    Unknown reaction-MAR  . Iodine   . Salicylates   . Sulfites   . Yellow Dyes (Non-Tartrazine) Other (See Comments)    Unknown reaction-MAR    Patient Measurements: Weight: 160 lb (72.576 kg) Heparin Dosing Weight:   Vital Signs: Temp: 99.6 F (37.6 C) (12/16 1304) Temp Source: Rectal (12/16 1304) BP: 94/57 mmHg (12/16 1515) Pulse Rate: 132 (12/16 1515)  Labs:  Recent Labs  11/25/2015 0953  HGB 13.5  HCT 42.0  PLT 146*  CREATININE 2.35*  TROPONINI 0.07*    Estimated Creatinine Clearance: 18.7 mL/min (by C-G formula based on Cr of 2.35).   Medical History: Past Medical History  Diagnosis Date  . COPD (chronic obstructive pulmonary disease) (HCC)     on nocturnal o2, Mouth breather  . CHF (congestive heart failure) ([place])   . HTN (hypertension)   . Dementia     alzherimers  . Parkinson disease ([place])   . Breast cancer [facility])     s/p mastectomy  . OSA (obstructive sleep apnea)     not on CPAP  . Chronic venous insufficiency     Medications:  Scheduled:  . enoxaparin (LOVENOX) injection  30 mg Subcutaneous Q24H  . letrozole  2.5 mg Oral QHS  . mometasone-formoterol  2 puff Inhalation BID  . sodium chloride  3 mL Intravenous Q12H  . Umeclidinium Bromide  1 puff Inhalation Daily  . Vitamin D3  50,000 Units Oral Q30 days    Assessment: CrCl = 18.7 ml/min  Goal of Therapy:  DVT prophylaxis   Plan:  Lovenox 40 mg SQ Q24H originally ordered, will adjust dose to Lovenox 30 mg SQ Q24H based on CrCl < 30 ml/min.   Jehiel Koepp D 11/14/2015,3:47 PM

## 2015-11-17 NOTE — ED Notes (Signed)
Family states pt has been more sleepy than usual for a few days. Pt will look at you when spoken to, will follow some commands

## 2015-11-17 NOTE — ED Notes (Signed)
Admitting Doctor made aware that pts BP is 80s/50s, was given verbal order to give pt 500 ml bolus and give the Cardizem even though the BP is so low.

## 2015-11-17 NOTE — Progress Notes (Signed)
Tylenol supp given

## 2015-11-17 NOTE — H&P (Addendum)
[place] [facility] [place] NAME: [name]    MR#:  [id number]  DATE OF BIRTH:  Feb 11, 1936  DATE OF ADMISSION:  11/28/2015  PRIMARY CARE PHYSICIAN: Dr. Viviana Simpler  REQUESTING/REFERRING PHYSICIAN: Eula Listen   CHIEF COMPLAINT:   Chief Complaint  Patient presents with  . Shortness of Breath    HISTORY OF PRESENT ILLNESS:  Elizabeth Farmer  is a 79 y.o. female with a known history of breast cancer status post mastectomy, history of COPD on nocturnal home oxygen, hypertension and Alzheimer's dementia with nonverbal at baseline presents to the hospital from [facility] skilled nursing facility secondary to increased somnolence and tachypnea. Patient is nonverbal at baseline and also highly somnolent now. Most of the history is obtained from her sister who is her healthcare power of attorney and she is at bedside. Patient was doing well at the living facility until April of this year, at which time she had her hip surgery done for broken hip. She was discharged to rehabilitation and could never completely recovered. Since then she's been wheelchair-bound, now they also have to use a Hoyer lift to move her. She was having some chills yesterday, remained more sleepy. Has been sleeping and felt fatigued for almost a week now. It was hard to arouse her this morning and she was breathing hard and so was sent over here. Has increased white count, appears dehydrated and also in acute renal failure. Urine is infected and also chest x-ray with infiltrate. She is being admitted for sepsis.  PAST MEDICAL HISTORY:   Past Medical History  Diagnosis Date  . COPD (chronic obstructive pulmonary disease) (HCC)     on nocturnal o2, Mouth breather  . CHF (congestive heart failure) ([place])   . HTN (hypertension)   . Dementia     alzherimers  . Parkinson disease ([place])   . Breast cancer [facility])     s/p mastectomy  . OSA (obstructive sleep apnea)     not on CPAP  . Chronic venous insufficiency     PAST SURGICAL HISTORY:   Past Surgical History  Procedure Laterality Date  . Breast surgery    . Hip fracture surgery      right hip fracture surgery    SOCIAL HISTORY:   Social History  Substance Use Topics  . Smoking status: Never Smoker   . Smokeless tobacco: Never Used     Comment: exposed to second hand smoke  . Alcohol Use: No    FAMILY HISTORY:   Family History  Problem Relation Age of Onset  . Hypertension    . Breast cancer Maternal Aunt   . CAD Father     DRUG ALLERGIES:   Allergies  Allergen Reactions  . Aspirin     Other reaction(s): Unknown  . Fish-Derived Products Other (See Comments)    Unknown reaction-MAR  . Iodine   . Salicylates   . Sulfites   . Yellow Dyes (Non-Tartrazine) Other (See Comments)    Unknown reaction-MAR    REVIEW OF SYSTEMS:   Review of Systems  Unable to perform ROS: dementia    MEDICATIONS AT HOME:   Prior to Admission medications   Medication Sig Start Date End Date Taking? Authorizing Provider  acetaminophen (TYLENOL) 500 MG tablet Take 500 mg by mouth every 6 (six) hours as needed for mild pain, moderate pain or fever. Do not exceed 4 gm apap/day from all sources.   Yes Historical Provider, MD  albuterol (PROVENTIL HFA;VENTOLIN HFA) 108 (90 BASE) MCG/ACT inhaler Inhale 2 puffs into the lungs every 4 (four) hours as needed for wheezing.   Yes Historical Provider, MD  Cholecalciferol (VITAMIN D3) 50000 UNITS CAPS Take 50,000 Units by mouth every 30 (thirty) days. Take on the 27th.   Yes Historical Provider, MD  Fluticasone-Salmeterol (ADVAIR) 250-50 MCG/DOSE AEPB Inhale 1 puff into the lungs 2 (two) times daily.   Yes Historical Provider, MD  furosemide (LASIX) 20 MG tablet Take 20 mg by mouth every morning.   Yes Historical Provider, MD  letrozole (FEMARA) 2.5 MG tablet Take 2.5 mg by mouth at bedtime.   Yes Historical Provider, MD  losartan (COZAAR) 100 MG tablet Take  100 mg by mouth every morning.   Yes Historical Provider, MD  POLYETHYLENE GLYCOL 3350 PO Take 17 g by mouth daily. Mix in [place] of fluid. *use large size*   Yes Historical Provider, MD  potassium chloride (K-DUR,KLOR-CON) 10 MEQ tablet Take 10 mEq by mouth 2 (two) times daily.   Yes Historical Provider, MD  senna-docusate (SENOKOT S) 8.6-50 MG per tablet Take 2 tablets by mouth daily as needed for mild constipation or moderate constipation.   Yes Historical Provider, MD  Umeclidinium Bromide (INCRUSE ELLIPTA) 62.5 MCG/INH AEPB Inhale 1 puff into the lungs daily.   Yes Historical Provider, MD      VITAL SIGNS:  Blood pressure 109/66, pulse 104, temperature 99.8 F (37.7 C), temperature source Rectal, resp. rate 34, weight 72.576 kg (160 lb), SpO2 99 %.  PHYSICAL EXAMINATION:   Physical Exam  GENERAL:  79 y.o.-year-old ill appearing patient lying in the bed, snoring and mouth breathing.  EYES: Pupils equal, round, reactive to light and accommodation. No scleral icterus. Extraocular muscles intact.  HEENT: Head atraumatic, normocephalic. Oropharynx and nasopharynx clear. Extremely dry mucous membranes noted. NECK:  Supple, no jugular venous distention. No thyroid enlargement, no tenderness.  LUNGS: moving air bilaterally, decreased bibasilar breath sounds, scattered rhonchi heard at the bases. no wheezing, rales or crepitation. No use of accessory muscles of respiration.  CARDIOVASCULAR: S1, S2 normal. No  rubs, or gallops. 3/6 systolic murmur is present  ABDOMEN: Soft, nontender, nondistended. Bowel sounds present. No organomegaly or mass.  EXTREMITIES: No pedal edema, cyanosis, or clubbing.  NEUROLOGIC: Patient is sleepy, but easily arousable. Cranial nerves II through XII are intact. His following commands and moving all 4 extremities in bed. Muscle strength equal in all extremities. Sensation intact. Gait not checked.  PSYCHIATRIC: The patient is arousable, alert and following simple  commands. Nonverbal at baseline. SKIN: No obvious rash, lesion, or ulcer.   LABORATORY PANEL:   CBC  Recent Labs Lab 11/11/2015 0953  WBC 40.1*  HGB 13.5  HCT 42.0  PLT 146*   ------------------------------------------------------------------------------------------------------------------  Chemistries   Recent Labs Lab 11/29/2015 0953  NA 145  K 4.2  CL 108  CO2 20*  GLUCOSE 224*  BUN 28*  CREATININE 2.35*  CALCIUM 10.7*  AST 63*  ALT 32  ALKPHOS 109  BILITOT 0.8   ------------------------------------------------------------------------------------------------------------------  Cardiac Enzymes  Recent Labs Lab 11/29/2015 0953  TROPONINI 0.07*   ------------------------------------------------------------------------------------------------------------------  RADIOLOGY:  Dg Chest Port 1 View  11/08/2015  CLINICAL DATA:  Shortness of breath for 1 day EXAM: PORTABLE CHEST 1 VIEW COMPARISON:  December 14, 2014 FINDINGS: There is patchy bibasilar atelectasis. Lungs elsewhere are clear. There is cardiomegaly with mild pulmonary venous hypertension. No adenopathy. Aorta is mildly tortuous. No bone lesions. IMPRESSION: Pulmonary vascular congestion  without frank edema or consolidation. There is patchy bibasilar atelectasis. Electronically Signed   By: Lowella Grip III M.D.   On: 11/05/2015 10:22    EKG:   Orders placed or performed during the hospital encounter of 11/24/2015  . ED EKG 12-Lead  . ED EKG 12-Lead    IMPRESSION AND PLAN:   Dorinne Wesby  is a 79 y.o. female with a known history of breast cancer status post mastectomy, history of COPD on nocturnal home oxygen, hypertension and Alzheimer's dementia with nonverbal at baseline presents to the hospital from [facility] skilled nursing facility secondary to increased somnolence and tachypnea.  #1 sepsis-likely source pneumonia and also UTI. -Blood and urine cultures ordered. -Chest x-ray with bibasilar  atelectasis but breathing pattern changed and she has been coughing more according to sister. -Cover for healthcare acquired pneumonia with broad-spectrum antibiotics including vancomycin and Zosyn at this time while cultures are pending. -Continue oxygen support.  #2 acute renal failure-ATN from sepsis and also prerenal. -IV fluids and monitor. Avoid nephrotoxins. -If does not improve, we will get renal ultrasound  #3 COPD-stable, cont o2 support, cont inhalers  #4 history of breast cancer-status post mastectomy. In remission. On letrozole.  #5 dementia-alert at baseline though nonverbal. Follows simple commands. More somnolent today secondary to sepsis. Likely has metabolic encephalopathy. -Continue to monitor improvement. Sister at bedside and confirms DO NOT RESUSCITATE status  #6 DVT prophylaxis-on Lovenox.    All the records are reviewed and case discussed with ED provider. Management plans discussed with the patient, family and they are in agreement.  CODE STATUS: DO NOT RESUSCITATE  TOTAL CRITICAL CARE TIME SPENT IN TAKING CARE OF THIS PATIENT: 50 minutes.    Note: Patient had fever spike, became tachycardic. Blood pressure dropped. Status changed to ICU, stepdown. -Discussed with sister at bedside. Sister is the healthcare power of attorney that this patient. Phone numbers- (H) [phone], (C) [phone] Sister agreed to current treatment with IV fluids, Iv ABX and pressors if needed- if no improvement or rapid decline in the  24hrs- she is open to discuss comfort care.   Gladstone Lighter M.D on 11/18/2015 at 12:07 PM  Between 7am to 6pm - Pager - [phone]  After 6pm go to [email or web address] - password [email or web address] [facility]  [place] [facility]  Office  [phone]  CC: Primary care physician; No primary care provider on file.

## 2015-11-17 NOTE — ED Notes (Signed)
Assumed care of pt at this time report taken from [place]

## 2015-11-17 NOTE — Progress Notes (Signed)
Notified Kalisetti of Troponin .24 She is aware and will continue to monitor.

## 2015-11-17 NOTE — Progress Notes (Signed)
Found patient with both IV's lying in bed.

## 2015-11-17 NOTE — ED Provider Notes (Signed)
[facility] Emergency Department Provider Note  ____________________________________________  Time seen: Approximately 9:56 AM  I have reviewed the triage vital signs and the nursing notes.   HISTORY  Chief Complaint Shortness of Breath  History of present illness the exam are limited due to patient being nonverbal and unable to comply with simple instructions.  HPI Elizabeth Farmer is a 79 y.o. female from [place] with a history of COPD on when necessary oxygen, CHF, HTN, dementia and Parkinson's disease presenting with altered mental status. Per report from the nursing home, the patient is nonverbal at baseline but has been more difficult to arouse.  She has had a fever to greater than 100. At this time, I am unable to get any additional information but on arrival she is tachycardic, tachypnea, and hypoxic to 92% on room air.   Past Medical History  Diagnosis Date  . COPD (chronic obstructive pulmonary disease) ([place])   . CHF (congestive heart failure) ([place])   . HTN (hypertension)   . Dementia   . Parkinson disease ([place])   . Breast cancer [facility])     Patient Active Problem List   Diagnosis Date Noted  . Acute respiratory failure ([place]) 08/13/2015    Past Surgical History  Procedure Laterality Date  . Breast suergy    . Hip fracture surgery      Current Outpatient Rx  Name  Route  Sig  Dispense  Refill  . acetaminophen (TYLENOL) 500 MG tablet   Oral   Take 500 mg by mouth every 6 (six) hours as needed for mild pain, moderate pain or fever. Do not exceed 4 gm apap/day from all sources.         Marland Kitchen albuterol (PROVENTIL HFA;VENTOLIN HFA) 108 (90 BASE) MCG/ACT inhaler   Inhalation   Inhale 2 puffs into the lungs every 4 (four) hours as needed for wheezing.         . Cholecalciferol (VITAMIN D3) 50000 UNITS CAPS   Oral   Take 50,000 Units by mouth every 30 (thirty) days. Take on the 27th.         . Fluticasone-Salmeterol (ADVAIR) 250-50  MCG/DOSE AEPB   Inhalation   Inhale 1 puff into the lungs 2 (two) times daily.         . furosemide (LASIX) 20 MG tablet   Oral   Take 20 mg by mouth every morning.         Marland Kitchen letrozole (FEMARA) 2.5 MG tablet   Oral   Take 2.5 mg by mouth at bedtime.         Marland Kitchen losartan (COZAAR) 100 MG tablet   Oral   Take 100 mg by mouth every morning.         Marland Kitchen POLYETHYLENE GLYCOL 3350 PO   Oral   Take 17 g by mouth daily. Mix in [place] of fluid. *use large size*         . potassium chloride (K-DUR,KLOR-CON) 10 MEQ tablet   Oral   Take 10 mEq by mouth 2 (two) times daily.         Marland Kitchen senna-docusate (SENOKOT S) 8.6-50 MG per tablet   Oral   Take 2 tablets by mouth daily as needed for mild constipation or moderate constipation.         Marland Kitchen Umeclidinium Bromide (INCRUSE ELLIPTA) 62.5 MCG/INH AEPB   Inhalation   Inhale 1 puff into the lungs daily.  Allergies Aspirin; Fish-derived products; Iodine; Salicylates; Sulfites; and Yellow dyes (non-tartrazine)  Family History  Problem Relation Age of Onset  . Hypertension      Social History Social History  Substance Use Topics  . Smoking status: Never Smoker   . Smokeless tobacco: Never Used  . Alcohol Use: No    Review of Systems Constitutional: Positive fever. Eyes: Unable to obtain ENT: Unable to obtain Cardiovascular: Unable to obtain Respiratory: Positive hypoxic. Gastrointestinal: Unable to obtain Genitourinary: Negative for dysuria. Musculoskeletal: Unable to obtain Skin: Unable to obtain Neurological: Increased somnolence  10-point ROS otherwise negative.  ____________________________________________   PHYSICAL EXAM:  VITAL SIGNS: ED Triage Vitals  Enc Vitals Group     BP --      Pulse --      Resp --      Temp --      Temp src --      SpO2 --      Weight --      Height --      Head Cir --      Peak Flow --      Pain Score --      Pain Loc --      Pain Edu? --      Excl. in [place]? --      Constitutional: Patient is somnolent but does open her eyes spontaneous lead to verbal stimulus. She does not answer any questions and is grossly unable to follow basic commands.  Eyes: Conjunctivae are normal.  EOMI. no scleral icterus. No discharge. Head: Atraumatic. No raccoon eyes or Battle sign. Nose: No congestion/rhinnorhea. Mouth/Throat: Mucous membranes are dry.  Neck: No stridor.  Supple.  No meningismus. Cardiovascular: Fast rate, regular rhythm. No murmurs, rubs or gallops.  Respiratory: Patient is tachypnea with accessory muscle use and mild retractions. She has good breath sounds bilaterally without significant wheezes, rales or rhonchi. Gastrointestinal: Obese, soft, nontender nondistended.  Musculoskeletal: No LE edema. No palpable cords in the calves. Neurologic:  Patient is nonverbal. Her face is symmetric. She moves all of her extremities.  Skin:  Skin is warm, dry and intact. No rash noted. Psychiatric: Unable to assess  ____________________________________________   LABS (all labs ordered are listed, but only abnormal results are displayed)  Labs Reviewed  CBC WITH DIFFERENTIAL/PLATELET - Abnormal; Notable for the following:    WBC 40.1 (*)    Platelets 146 (*)    Neutro Abs 37.5 (*)    Lymphs Abs 0.4 (*)    Monocytes Absolute 2.0 (*)    Basophils Absolute 0.2 (*)    All other components within normal limits  LACTIC ACID, PLASMA - Abnormal; Notable for the following:    Lactic Acid, Venous 7.6 (*)    All other components within normal limits  BLOOD GAS, VENOUS - Abnormal; Notable for the following:    pCO2, Ven 31 (*)    Bicarbonate 19.6 (*)    Acid-base deficit 4.0 (*)    All other components within normal limits  URINALYSIS COMPLETEWITH MICROSCOPIC ([facility] ONLY) - Abnormal; Notable for the following:    Color, Urine AMBER (*)    APPearance CLOUDY (*)    Ketones, ur TRACE (*)    Hgb urine dipstick 3+ (*)    Protein, ur 100 (*)    Leukocytes, UA 3+  (*)    Bacteria, UA RARE (*)    Squamous Epithelial / LPF 0-5 (*)    All other components within normal limits  CULTURE,  BLOOD (ROUTINE X 2)  CULTURE, BLOOD (ROUTINE X 2)  URINE CULTURE  COMPREHENSIVE METABOLIC PANEL  LACTIC ACID, PLASMA  TROPONIN I  BRAIN NATRIURETIC PEPTIDE   ____________________________________________  EKG  ED ECG REPORT I, Eula Listen, the attending physician, personally viewed and interpreted this ECG.   Date: 11/06/2015  EKG Time: 940  Rate: 1:15  Rhythm: Sinus tachycardia  Axis: Leftward  Intervals:none  ST&T Change: Nonspecific T-wave inversion isolated to lead V3. No ST elevations. No reciprocal changes.  ____________________________________________  RADIOLOGY  Dg Chest Port 1 View  11/08/2015  CLINICAL DATA:  Shortness of breath for 1 day EXAM: PORTABLE CHEST 1 VIEW COMPARISON:  December 14, 2014 FINDINGS: There is patchy bibasilar atelectasis. Lungs elsewhere are clear. There is cardiomegaly with mild pulmonary venous hypertension. No adenopathy. Aorta is mildly tortuous. No bone lesions. IMPRESSION: Pulmonary vascular congestion without frank edema or consolidation. There is patchy bibasilar atelectasis. Electronically Signed   By: Lowella Grip III M.D.   On: 11/14/2015 10:22    ____________________________________________   PROCEDURES  Procedure(s) performed: None  Critical Care performed: Yes, see critical care note(s) ____________________________________________   INITIAL IMPRESSION / ASSESSMENT AND PLAN / ED COURSE  Pertinent labs & imaging results that were available during my care of the patient were reviewed by me and considered in my medical decision making (see chart for details).  79 y.o. female with a history of COPD, CHF, dementia who is presenting with tachypnea, accessory muscle use, dry mucous membranes, history of fever, and hypoxia. I am concerned that the patient has sepsis and the likely source is  pulmonary. It is possible given her dementia and Parkinson's disease that she has either chronic or an acute aspiration event. We will plan to evaluate her and admit her to the hospital. I will initiate immediate antibiotics as well as IV fluids for resuscitation.  ----------------------------------------- 10:39 AM on 11/20/2015 -----------------------------------------  On arrival to the emergency department, the patient was hypotensive, tachycardic, and hypoxic. Her hypoxia is improved with supplemental oxygenation, and her heart rate has come down from the 120s to the low 100s. Her blood pressure has been as low as 71/52 and is now 104/69. Her lab studies are suggestive of a urinary tract infection and possible pneumonia, for which I have given her empiric antibiotics. She appears to be clinically improving but will need close monitoring in the setting of this acute sepsis. An admission to the hospital.  CRITICAL CARE Performed by: Eula Listen   Total critical care time: 45 minutes  Critical care time was exclusive of separately billable procedures and treating other patients.  Critical care was necessary to treat or prevent imminent or life-threatening deterioration.  Critical care was time spent personally by me on the following activities: development of treatment plan with patient and/or surrogate as well as nursing, discussions with consultants, evaluation of patient's response to treatment, examination of patient, obtaining history from patient or surrogate, ordering and performing treatments and interventions, ordering and review of laboratory studies, ordering and review of radiographic studies, pulse oximetry and re-evaluation of patient's condition.   ____________________________________________  FINAL CLINICAL IMPRESSION(S) / ED DIAGNOSES  Final diagnoses:  Sepsis, due to unspecified organism [facility])  UTI (lower urinary tract infection)  Healthcare-associated  pneumonia  Hypotension, unspecified hypotension type      NEW MEDICATIONS STARTED DURING THIS VISIT:  New Prescriptions   No medications on file     Eula Listen, MD 11/05/2015 1040

## 2015-11-17 NOTE — Progress Notes (Signed)
Patient restless in bed.  Heart rate in 120's. Resp  30's.  Fever.   Informed Dr. Judeen Hammans.  Haldol to be ordered and IVF bolus to be given when IV established.

## 2015-11-17 NOTE — Progress Notes (Signed)
ANTIBIOTIC CONSULT NOTE - INITIAL  Pharmacy Consult for Vancomycin and Zosyn Indication: Sepsis   Allergies  Allergen Reactions  . Aspirin     Other reaction(s): Unknown  . Fish-Derived Products Other (See Comments)    Unknown reaction-MAR  . Iodine   . Salicylates   . Sulfites   . Yellow Dyes (Non-Tartrazine) Other (See Comments)    Unknown reaction-MAR    Patient Measurements: Weight: 160 lb (72.576 kg) Adjusted Body Weight: 59.1 kg  Vital Signs: Temp: 101.8 F (38.8 C) (12/16 1553) Temp Source: Axillary (12/16 1553) BP: 94/57 mmHg (12/16 1515) Pulse Rate: 132 (12/16 1515) Intake/Output from previous day:   Intake/Output from this shift:    Labs:  Recent Labs  11/12/2015 0953  WBC 40.1*  HGB 13.5  PLT 146*  CREATININE 2.35*   Estimated Creatinine Clearance: 18.7 mL/min (by C-G formula based on Cr of 2.35). No results for input(s): VANCOTROUGH, VANCOPEAK, VANCORANDOM, GENTTROUGH, GENTPEAK, GENTRANDOM, TOBRATROUGH, TOBRAPEAK, TOBRARND, AMIKACINPEAK, AMIKACINTROU, AMIKACIN in the last 72 hours.   Microbiology: Recent Results (from the past 720 hour(s))  Blood Culture (routine x 2)     Status: None (Preliminary result)   Collection Time: 11/18/2015  9:55 AM  Result Value Ref Range Status   Specimen Description BLOOD RIGHT ARM  Final   Special Requests BOTTLES DRAWN AEROBIC AND ANAEROBIC  1CC  Final   Culture NO GROWTH < 12 HOURS  Final   Report Status PENDING  Incomplete  Blood Culture (routine x 2)     Status: None (Preliminary result)   Collection Time: 11/15/2015 10:05 AM  Result Value Ref Range Status   Specimen Description BLOOD RIGHT HAND  Final   Special Requests BOTTLES DRAWN AEROBIC AND ANAEROBIC  1CC  Final   Culture NO GROWTH < 12 HOURS  Final   Report Status PENDING  Incomplete    Medical History: Past Medical History  Diagnosis Date  . COPD (chronic obstructive pulmonary disease) (HCC)     on nocturnal o2, Mouth breather  . CHF (congestive  heart failure) ([place])   . HTN (hypertension)   . Dementia     alzherimers  . Parkinson disease ([place])   . Breast cancer [facility])     s/p mastectomy  . OSA (obstructive sleep apnea)     not on CPAP  . Chronic venous insufficiency     Medications:  Scheduled:  . enoxaparin (LOVENOX) injection  30 mg Subcutaneous Q24H  . letrozole  2.5 mg Oral QHS  . mometasone-formoterol  2 puff Inhalation BID  . piperacillin-tazobactam (ZOSYN)  IV  3.375 g Intravenous Q12H  . sodium chloride  3 mL Intravenous Q12H  . tiotropium  18 mcg Inhalation Daily  . [START ON 11/18/2015] vancomycin  750 mg Intravenous Q36H  . Vitamin D3  50,000 Units Oral Q30 days   Infusions:  . sodium chloride 100 mL/hr at 12/01/2015 1553   Assessment: 79 y/o F with sepsis possibly from PNA and UTI.   Goal of Therapy:  Vancomycin trough level 15-20 mcg/ml  Plan:  Vancomycin 1250 mg iv once given. Will order vancomycin 750 mg iv q 36 hours with stacked dosing. Patient with acute renal failure so will likely need to adjust vancomycin dose as renal function improves. Will not order trough at this point.  Zosyn 3.375 g iv once given then 3.375 g EI q 12 hours.   Will f/u renal function and culture results.    Ulice Dash D 11/28/2015,4:14 PM

## 2015-11-18 ENCOUNTER — Inpatient Hospital Stay: Payer: Medicare Other

## 2015-11-18 ENCOUNTER — Inpatient Hospital Stay
Admit: 2015-11-18 | Discharge: 2015-11-18 | Disposition: A | Discharge status: Home / Self Care | Payer: Medicare Other | Attending: Internal Medicine | Admitting: Internal Medicine

## 2015-11-18 LAB — BLOOD CULTURE ID PANEL (REFLEXED)
ACINETOBACTER BAUMANNII: NOT DETECTED
CANDIDA GLABRATA: NOT DETECTED
CANDIDA KRUSEI: NOT DETECTED
CANDIDA PARAPSILOSIS: NOT DETECTED
Candida albicans: NOT DETECTED
Candida tropicalis: NOT DETECTED
Carbapenem resistance: NOT DETECTED
ENTEROBACTER CLOACAE COMPLEX: NOT DETECTED
ENTEROCOCCUS SPECIES: NOT DETECTED
ESCHERICHIA COLI: NOT DETECTED
Enterobacteriaceae species: NOT DETECTED
Haemophilus influenzae: NOT DETECTED
KLEBSIELLA OXYTOCA: NOT DETECTED
Klebsiella pneumoniae: NOT DETECTED
LISTERIA MONOCYTOGENES: NOT DETECTED
METHICILLIN RESISTANCE: NOT DETECTED
Neisseria meningitidis: NOT DETECTED
PROTEUS SPECIES: DETECTED — AB
PSEUDOMONAS AERUGINOSA: NOT DETECTED
SERRATIA MARCESCENS: NOT DETECTED
STAPHYLOCOCCUS AUREUS BCID: NOT DETECTED
STREPTOCOCCUS AGALACTIAE: NOT DETECTED
STREPTOCOCCUS PNEUMONIAE: NOT DETECTED
STREPTOCOCCUS PYOGENES: NOT DETECTED
STREPTOCOCCUS SPECIES: NOT DETECTED
Staphylococcus species: NOT DETECTED
Vancomycin resistance: NOT DETECTED

## 2015-11-18 LAB — BASIC METABOLIC PANEL
ANION GAP: 12 (ref 5–15)
BUN: 36 mg/dL — AB (ref 6–20)
CALCIUM: 8.9 mg/dL (ref 8.9–10.3)
CO2: 18 mmol/L — AB (ref 22–32)
Chloride: 121 mmol/L — ABNORMAL HIGH (ref 101–111)
Creatinine, Ser: 2.41 mg/dL — ABNORMAL HIGH (ref 0.44–1.00)
GFR calc Af Amer: 21 mL/min — ABNORMAL LOW (ref >60)
GFR, EST NON AFRICAN AMERICAN: 18 mL/min — AB (ref >60)
GLUCOSE: 117 mg/dL — AB (ref 65–99)
Potassium: 4.1 mmol/L (ref 3.5–5.1)
Sodium: 151 mmol/L — ABNORMAL HIGH (ref 135–145)

## 2015-11-18 LAB — CBC
HEMATOCRIT: 37.6 % (ref 35.0–47.0)
Hemoglobin: 12 g/dL (ref 12.0–16.0)
MCH: 28.1 pg (ref 26.0–34.0)
MCHC: 31.9 g/dL — AB (ref 32.0–36.0)
MCV: 88 fL (ref 80.0–100.0)
PLATELETS: 68 10*3/uL — AB (ref 150–440)
RBC: 4.27 MIL/uL (ref 3.80–5.20)
RDW: 15 % — AB (ref 11.5–14.5)
WBC: 19.1 10*3/uL — AB (ref 3.6–11.0)

## 2015-11-18 LAB — TROPONIN I: Troponin I: 0.22 ng/mL — ABNORMAL HIGH (ref <0.031)

## 2015-11-18 MED ORDER — MORPHINE SULFATE (PF) 2 MG/ML IV SOLN
0.5000 mg | INTRAVENOUS | Status: DC | PRN
  Administered 2015-11-18 – 2015-11-19 (×5): 1 mg via INTRAVENOUS
  Filled 2015-11-18 (×5): qty 1

## 2015-11-18 MED ORDER — FLUCONAZOLE IN SODIUM CHLORIDE 400-0.9 MG/200ML-% IV SOLN
400.0000 mg | Freq: Once | INTRAVENOUS | Status: AC
  Administered 2015-11-18: 400 mg via INTRAVENOUS
  Filled 2015-11-18: qty 200

## 2015-11-18 MED ORDER — SODIUM CHLORIDE 0.9 % IV SOLN
Freq: Once | INTRAVENOUS | Status: AC
  Administered 2015-11-18: 01:00:00 via INTRAVENOUS

## 2015-11-18 MED ORDER — DEXTROSE 5 % IV SOLN
INTRAVENOUS | Status: DC
  Administered 2015-11-18: 10:00:00 via INTRAVENOUS

## 2015-11-18 MED ORDER — DEXTROSE-NACL 5-0.45 % IV SOLN
INTRAVENOUS | Status: DC
Start: 2015-11-18 — End: 2015-11-18
  Administered 2015-11-18: 08:00:00 via INTRAVENOUS

## 2015-11-18 MED ORDER — MORPHINE SULFATE (PF) 2 MG/ML IV SOLN
INTRAVENOUS | Status: AC
  Administered 2015-11-18: 2 mg via INTRAVENOUS
  Filled 2015-11-18: qty 1

## 2015-11-18 MED ORDER — LORAZEPAM 2 MG/ML IJ SOLN: 0.5000 mg | INTRAMUSCULAR | Status: DC | PRN

## 2015-11-18 MED ORDER — FLUCONAZOLE IN SODIUM CHLORIDE 200-0.9 MG/100ML-% IV SOLN: 200.0000 mg | INTRAVENOUS | Status: DC

## 2015-11-18 MED ORDER — MORPHINE SULFATE (PF) 2 MG/ML IV SOLN
2.0000 mg | Freq: Once | INTRAVENOUS | Status: AC
  Administered 2015-11-18: 2 mg via INTRAVENOUS

## 2015-11-18 NOTE — Progress Notes (Signed)
[place] [place] [place] NAME: Yenty Cockran    MR#:  [id number]  DATE OF BIRTH:  02-03-1936  SUBJECTIVE:  CHIEF COMPLAINT:   Chief Complaint  Patient presents with  . Shortness of Breath   -Admitted for sepsis yesterday. Has pneumonia and UTI. -Blood cultures with gram-negative bacteremia as well as urine cultures. -Patient appears critically ill. Not responsive at this time, very tachypneic and mouth breathing and continues to have fevers. Blood pressure and heart rate are improved than yesterday. -Family is at bedside  REVIEW OF SYSTEMS:  Review of Systems  Unable to perform ROS: critical illness    DRUG ALLERGIES:   Allergies  Allergen Reactions  . Aspirin     Other reaction(s): Unknown  . Fish-Derived Products Other (See Comments)    Unknown reaction-MAR  . Iodine   . Salicylates   . Sulfites   . Yellow Dyes (Non-Tartrazine) Other (See Comments)    Unknown reaction-MAR    VITALS:  Blood pressure 106/70, pulse 120, temperature 99.1 F (37.3 C), temperature source Axillary, resp. rate 30, height 5\' 4"  (1.626 m), weight 72 kg (158 lb 11.7 oz), SpO2 98 %.  PHYSICAL EXAMINATION:  Physical Exam  GENERAL: 79 y.o.-year-old critically ill appearing patient lying in the bed, tachypneic and mouth breathing.  EYES: Pupils equal, round, reactive to light and accommodation. No scleral icterus.  HEENT: Head atraumatic, normocephalic. Oropharynx and nasopharynx clear. dry mucous membranes noted. NECK: Supple, no jugular venous distention. No thyroid enlargement, no tenderness. Using accessory muscles to breathe LUNGS: Diffuse rhonchi bilaterally heard at the bases. no wheezing, rales or crepitation. Using accessory muscles of respiration.  CARDIOVASCULAR: S1, S2 normal. No rubs, or gallops. 3/6 systolic murmur is present  ABDOMEN: Soft, nontender, nondistended. Bowel sounds present. No organomegaly or mass.  EXTREMITIES:  No pedal edema, cyanosis, or clubbing.  NEUROLOGIC: Patient is lethargic, not arousable at this time. Mouth breathing and appears ill PSYCHIATRIC: The patient is not arousable. Nonverbal at baseline. SKIN: No obvious rash, lesion, or ulcer.   LABORATORY PANEL:   CBC  Recent Labs Lab 11/18/15 0434  WBC 19.1*  HGB 12.0  HCT 37.6  PLT 68*   ------------------------------------------------------------------------------------------------------------------  Chemistries   Recent Labs Lab 11/24/2015 0953  11/18/15 0434  NA 145  < > 151*  K 4.2  < > 4.1  CL 108  < > 121*  CO2 20*  < > 18*  GLUCOSE 224*  < > 117*  BUN 28*  < > 36*  CREATININE 2.35*  < > 2.41*  CALCIUM 10.7*  < > 8.9  AST 63*  --   --   ALT 32  --   --   ALKPHOS 109  --   --   BILITOT 0.8  --   --   < > = values in this interval not displayed. ------------------------------------------------------------------------------------------------------------------  Cardiac Enzymes  Recent Labs Lab 11/18/15 0434  TROPONINI 0.22*   ------------------------------------------------------------------------------------------------------------------  RADIOLOGY:  US Renal  11/18/2015  CLINICAL DATA:  Patient acute renal failure. EXAM: RENAL / URINARY TRACT ULTRASOUND COMPLETE COMPARISON:  Chest CT 08/14/2015. FINDINGS: Right Kidney: Length: 12.9 cm. Echogenicity within normal limits. No mass or hydronephrosis visualized. Left Kidney: Length: 13.4 cm. Mild hydronephrosis. There is a 7 mm echogenic focus within the renal collecting system. Bladder: Decompressed with Foley catheter. Incidental note is made of hepatic steatosis. IMPRESSION: There is mild left hydronephrosis. Echogenic focus within the left renal collecting system favored  to represent a small stone. Electronically Signed   By: Lovey Newcomer M.D.   On: 11/18/2015 10:13   Dg Chest Port 1 View  11/04/2015  CLINICAL DATA:  Shortness of breath for 1 day EXAM:  PORTABLE CHEST 1 VIEW COMPARISON:  December 14, 2014 FINDINGS: There is patchy bibasilar atelectasis. Lungs elsewhere are clear. There is cardiomegaly with mild pulmonary venous hypertension. No adenopathy. Aorta is mildly tortuous. No bone lesions. IMPRESSION: Pulmonary vascular congestion without frank edema or consolidation. There is patchy bibasilar atelectasis. Electronically Signed   By: Lowella Grip III M.D.   On: 11/30/2015 10:22    EKG:   Orders placed or performed during the hospital encounter of 11/18/2015  . ED EKG 12-Lead  . ED EKG 12-Lead    ASSESSMENT AND PLAN:   Schuylar Behanna is a 79 y.o. female with a known history of breast cancer status post mastectomy, history of COPD on nocturnal home oxygen, hypertension and Alzheimer's dementia with nonverbal at baseline presents to the hospital from [facility] skilled nursing facility secondary to increased somnolence and tachypnea.  She has the following diagnoses: -Sepsis-likely source pneumonia and UTI. Has gram-negative bacteremia -Acute renal failure-ATN from sepsis. Decreased urine output with no improvement in renal function. -Hypernatremia -Metabolic encephalopathy -COPD -History of breast cancer -Severe dementia -Metabolic acidosis  Patient has significantly worsened since yesterday. She is oliguric at this time and worsening kidney function. Not arousable, retaining her blood pressure and heart rate at this time though. Sister and niece at bedside. They agree that patient is a DO NOT RESUSCITATE. After extensive discussion with the family, they have agreed that this looks like terminal illness at this time and patient might not recover from this in a meaningful condition. Offered comfort care measures. Agreeable at this time. We'll transfer patient to oncology floor for comfort care. Continue to monitor and if stable family wishes to transfer her to hospice home tomorrow. Discontinue fluids and antibiotics and no  more blood draws. -We'll start morphine when necessary for air hunger and pain. Ativan for anxiety. Foley catheter can be continued for comfort     All the records are reviewed and case discussed with Care Management/Social Workerr. Management plans discussed with the patient, family and they are in agreement.  CODE STATUS: DNR  TOTAL CRITICAL CARE TIME SPENT IN TAKING CARE OF THIS PATIENT: 38 minutes.     Gladstone Lighter M.D on 11/18/2015 at 11:29 AM  Between 7am to 6pm - Pager - [phone]  After 6pm go to [email or web address] - password [email or web address] [facility]  [facility]  Office  [phone]  CC: Primary care physician; No primary care provider on file.

## 2015-11-18 NOTE — Progress Notes (Signed)
Urine output low 63mls out since foley inserted.  Informed Dr. Marcille Blanco.  NS 528mls bolus ordered. If not improvement will consult Nephrology

## 2015-11-18 NOTE — Progress Notes (Signed)
Resp mid 30's.  Sats 100%. Informed Dr. Marcille Blanco.  Morphine ordered for tachypnea

## 2015-11-18 NOTE — Consult Note (Signed)
[facility]  CONSULT NOTE    Date: 11/18/2015                  Patient Name:  Elizabeth Farmer  MRN: [id number]  DOB: [birth year]  Age / Sex: 79 y.o., female         PCP: No primary care provider on file.                 Service Requesting Consult: Dr. Tressia Miners                 Reason for Consult: Acute renal failure, metabolic acidosis and hypernatremia            History of Present Illness: Elizabeth Farmer is a 79 y.o. white female with Alzheimer's dementia, breast cancer, COPD, hypertension. Obstructive sleep apnea, parkinson's, who was admitted to [facility] on 11/26/2015 for UTI (lower urinary tract infection) [N39.0] Acute renal insufficiency [N28.9] Healthcare-associated pneumonia [J18.9] Sepsis, due to unspecified organism ([place]) [A41.9] Hypotension, unspecified hypotension type [I95.9]   Sister at bedside who is health care power of attorney. Patient is DNR. Patient at baseline is nonverbal. Niece also at bedside.   Admitted with a creatinine of 2.53 from baseline of 1 with eGFR of 59-60. Started on IV fluids D51/2NS at 100. Urine output nonoliguric. Creatinine improved to 2.41. However sodium has risen to 151 and patient has more metabolic acidosis.   Patient's urine culture is growing gram negative rods. Empiric antibiotics of zosyn, vancomycin and fluconazole.    Medications: Outpatient medications: Prescriptions prior to admission  Medication Sig Dispense Refill Last Dose  . acetaminophen (TYLENOL) 500 MG tablet Take 500 mg by mouth every 6 (six) hours as needed for mild pain, moderate pain or fever. Do not exceed 4 gm apap/day from all sources.   prn at prn  . albuterol (PROVENTIL HFA;VENTOLIN HFA) 108 (90 BASE) MCG/ACT inhaler Inhale 2 puffs into the lungs every 4 (four) hours as needed for wheezing.   prn at prn  . Cholecalciferol (VITAMIN D3) 50000 UNITS CAPS Take 50,000 Units by mouth every 30 (thirty) days. Take on the 27th.   10/28/2015  at 0800  . Fluticasone-Salmeterol (ADVAIR) 250-50 MCG/DOSE AEPB Inhale 1 puff into the lungs 2 (two) times daily.   11/16/2015 at 2000  . furosemide (LASIX) 20 MG tablet Take 20 mg by mouth every morning.   11/16/2015 at 0800  . letrozole (FEMARA) 2.5 MG tablet Take 2.5 mg by mouth at bedtime.   11/16/2015 at 2000  . losartan (COZAAR) 100 MG tablet Take 100 mg by mouth every morning.   11/16/2015 at 0800  . POLYETHYLENE GLYCOL 3350 PO Take 17 g by mouth daily. Mix in [place] of fluid. *use large size*   11/16/2015 at 0800  . potassium chloride (K-DUR,KLOR-CON) 10 MEQ tablet Take 10 mEq by mouth 2 (two) times daily.   11/16/2015 at 2000  . senna-docusate (SENOKOT S) 8.6-50 MG per tablet Take 2 tablets by mouth daily as needed for mild constipation or moderate constipation.   prn at prn  . Umeclidinium Bromide (INCRUSE ELLIPTA) 62.5 MCG/INH AEPB Inhale 1 puff into the lungs daily.   11/16/2015 at 0800    Current medications: Current Facility-Administered Medications  Medication Dose Route Frequency Provider Last Rate Last Dose  . acetaminophen (TYLENOL) suppository 650 mg  650 mg Rectal Q4H PRN Gladstone Lighter, MD   650 mg at 11/18/15 0545  . acetaminophen (TYLENOL) tablet 500 mg  500  mg Oral Q6H PRN Gladstone Lighter, MD   500 mg at 12/01/2015 1633  . albuterol (PROVENTIL) (2.5 MG/3ML) 0.083% nebulizer solution 2.5 mg  2.5 mg Inhalation Q4H PRN Gladstone Lighter, MD      . antiseptic oral rinse (CPC / CETYLPYRIDINIUM CHLORIDE 0.05%) solution 7 mL  7 mL Mouth Rinse BID Gladstone Lighter, MD   7 mL at 11/16/2015 2200  . dextrose 5 % solution   Intravenous Continuous Lavonia Dana, MD      . enoxaparin (LOVENOX) injection 30 mg  30 mg Subcutaneous Q24H Gladstone Lighter, MD   30 mg at 11/23/2015 1631  . [START ON 12/01/15] fluconazole (DIFLUCAN) IVPB 200 mg  200 mg Intravenous Q24H Harrie Foreman, MD      . haloperidol lactate (HALDOL) injection 1 mg  1 mg Intravenous Q6H PRN Theodoro Grist, MD      .  letrozole [facility]) tablet 2.5 mg  2.5 mg Oral QHS Gladstone Lighter, MD   2.5 mg at 11/29/2015 2200  . metoprolol (LOPRESSOR) injection 2.5 mg  2.5 mg Intravenous Q4H PRN Gladstone Lighter, MD   2.5 mg at 11/30/2015 1714  . mometasone-formoterol (DULERA) 100-5 MCG/ACT inhaler 2 puff  2 puff Inhalation BID Gladstone Lighter, MD   2 puff at 11/26/2015 2000  . ondansetron (ZOFRAN) tablet 4 mg  4 mg Oral Q6H PRN Gladstone Lighter, MD       Or  . ondansetron (ZOFRAN) injection 4 mg  4 mg Intravenous Q6H PRN Gladstone Lighter, MD      . piperacillin-tazobactam (ZOSYN) IVPB 3.375 g  3.375 g Intravenous Q12H Gladstone Lighter, MD   3.375 g at 11/05/2015 2143  . senna-docusate (Senokot-S) tablet 2 tablet  2 tablet Oral Daily PRN Gladstone Lighter, MD      . sodium chloride 0.9 % bolus 500 mL  500 mL Intravenous Once Theodoro Grist, MD      . sodium chloride 0.9 % injection 3 mL  3 mL Intravenous Q12H Gladstone Lighter, MD   3 mL at 11/30/2015 1900  . tiotropium (SPIRIVA) inhalation capsule 18 mcg  18 mcg Inhalation Daily Gladstone Lighter, MD   18 mcg at 11/05/2015 1644  . vancomycin (VANCOCIN) IVPB 750 mg/150 ml premix  750 mg Intravenous Q36H Gladstone Lighter, MD      . Vitamin D3 CAPS 50,000 Units  50,000 Units Oral Q30 days Gladstone Lighter, MD   50,000 Units at 12/02/2015 1545      Allergies: Allergies  Allergen Reactions  . Aspirin     Other reaction(s): Unknown  . Fish-Derived Products Other (See Comments)    Unknown reaction-MAR  . Iodine   . Salicylates   . Sulfites   . Yellow Dyes (Non-Tartrazine) Other (See Comments)    Unknown reaction-MAR      Past Medical History: Past Medical History  Diagnosis Date  . COPD (chronic obstructive pulmonary disease) (HCC)     on nocturnal o2, Mouth breather  . CHF (congestive heart failure) ([place])   . HTN (hypertension)   . Dementia     alzherimers  . Parkinson disease ([place])   . Breast cancer [facility])     s/p mastectomy  . OSA (obstructive sleep apnea)      not on CPAP  . Chronic venous insufficiency      Past Surgical History: Past Surgical History  Procedure Laterality Date  . Breast surgery    . Hip fracture surgery      right hip fracture surgery  Family History: Family History  Problem Relation Age of Onset  . Hypertension    . Breast cancer Maternal Aunt   . CAD Father      Social History: Social History   Social History  . Marital Status: Widowed    Spouse Name: N/A  . Number of Children: N/A  . Years of Education: N/A   Occupational History  . Not on file.   Social History Main Topics  . Smoking status: Never Smoker   . Smokeless tobacco: Never Used     Comment: exposed to second hand smoke  . Alcohol Use: No  . Drug Use: No  . Sexual Activity: Not on file   Other Topics Concern  . Not on file   Social History Narrative   From [facility]. Wheel chair bound and nonverbal at baseline     Review of Systems: Review of Systems  Unable to perform ROS: dementia    Vital Signs: Blood pressure 106/70, pulse 120, temperature 99.1 F (37.3 C), temperature source Axillary, resp. rate 30, height 5' 4"  (1.626 m), weight 72 kg (158 lb 11.7 oz), SpO2 98 %.  Weight trends: Filed Weights   11/08/2015 1005 11/26/2015 1553  Weight: 72.576 kg (160 lb) 72 kg (158 lb 11.7 oz)    Physical Exam: General: Critically ill  Head: Dry mucosal membranes  Eyes: Eyes closed  Neck: Supple, trachea midline  Lungs:  Clear to auscultation  Heart: tachycardia  Abdomen:  Soft, nontender,   Extremities: no peripheral edema.  Neurologic: Not responding to verbal stimuli  Skin: No lesions        Lab results: Basic Metabolic Panel:  Recent Labs Lab 11/20/2015 0953 11/11/2015 2122 11/18/15 0434  NA 145 147* 151*  K 4.2 3.6 4.1  CL 108 118* 121*  CO2 20* 20* 18*  GLUCOSE 224* 115* 117*  BUN 28* 32* 36*  CREATININE 2.35* 2.53* 2.41*  CALCIUM 10.7* 9.2 8.9    Liver Function Tests:  Recent Labs Lab  11/27/2015 0953  AST 63*  ALT 32  ALKPHOS 109  BILITOT 0.8  PROT 7.3  ALBUMIN 3.6   No results for input(s): LIPASE, AMYLASE in the last 168 hours. No results for input(s): AMMONIA in the last 168 hours.  CBC:  Recent Labs Lab 11/15/2015 0953 11/16/2015 2122 11/18/15 0434  WBC 40.1* 28.1* 19.1*  NEUTROABS 37.5*  --   --   HGB 13.5 12.5 12.0  HCT 42.0 38.8 37.6  MCV 87.0 87.8 88.0  PLT 146* 87* 68*    Cardiac Enzymes:  Recent Labs Lab 11/18/2015 0953 11/24/2015 1553 11/08/2015 2155 11/18/15 0434  TROPONINI 0.07* 0.24* 0.21* 0.22*    BNP: Invalid input(s): POCBNP  CBG:  Recent Labs Lab 11/03/2015 1542  [place] 126*    Microbiology: Results for orders placed or performed during the hospital encounter of 11/20/2015  Urine culture     Status: None (Preliminary result)   Collection Time: 11/04/2015  9:53 AM  Result Value Ref Range Status   Specimen Description URINE, RANDOM  Final   Special Requests NONE  Final   Culture   Final    >=100,000 COLONIES/mL GRAM NEGATIVE RODS IDENTIFICATION AND SUSCEPTIBILITIES TO FOLLOW    Report Status PENDING  Incomplete  Blood Culture (routine x 2)     Status: None (Preliminary result)   Collection Time: 11/10/2015  9:55 AM  Result Value Ref Range Status   Specimen Description BLOOD RIGHT ARM  Final   Special Requests  BOTTLES DRAWN AEROBIC AND ANAEROBIC  1CC  Final   Culture  Setup Time   Final    GRAM NEGATIVE RODS IN BOTH AEROBIC AND ANAEROBIC BOTTLES Organism ID to follow CRITICAL RESULT CALLED TO, READ BACK BY AND VERIFIED WITH: MATT MCBANE  AT 0335 ON 11/18/15 RWW CONFIRMED BY [facility]    Culture   Final    GRAM NEGATIVE RODS IN BOTH AEROBIC AND ANAEROBIC BOTTLES    Report Status PENDING  Incomplete  Blood Culture (routine x 2)     Status: None (Preliminary result)   Collection Time: 11/08/2015 10:05 AM  Result Value Ref Range Status   Specimen Description BLOOD RIGHT HAND  Final   Special Requests BOTTLES DRAWN AEROBIC AND  ANAEROBIC  1CC  Final   Culture  Setup Time   Final    GRAM NEGATIVE RODS IN BOTH AEROBIC AND ANAEROBIC BOTTLES CRITICAL RESULT CALLED TO, READ BACK BY AND VERIFIED WITH: MATT MCBANE AT 0335 ON 11/18/15 RWW CONFIRMED BY [facility] Organism ID to follow    Culture   Final    GRAM NEGATIVE RODS IN BOTH AEROBIC AND ANAEROBIC BOTTLES    Report Status PENDING  Incomplete  Blood Culture ID Panel (Reflexed)     Status: Abnormal   Collection Time: 11/07/2015 10:05 AM  Result Value Ref Range Status   Enterococcus species NOT DETECTED NOT DETECTED Final   Listeria monocytogenes NOT DETECTED NOT DETECTED Final   Staphylococcus species NOT DETECTED NOT DETECTED Final   Staphylococcus aureus NOT DETECTED NOT DETECTED Final   Streptococcus species NOT DETECTED NOT DETECTED Final   Streptococcus agalactiae NOT DETECTED NOT DETECTED Final   Streptococcus pneumoniae NOT DETECTED NOT DETECTED Final   Streptococcus pyogenes NOT DETECTED NOT DETECTED Final   Acinetobacter baumannii NOT DETECTED NOT DETECTED Final   Enterobacteriaceae species NOT DETECTED NOT DETECTED Final   Enterobacter cloacae complex NOT DETECTED NOT DETECTED Final   Escherichia coli NOT DETECTED NOT DETECTED Final   Klebsiella oxytoca NOT DETECTED NOT DETECTED Final   Klebsiella pneumoniae NOT DETECTED NOT DETECTED Final   Proteus species DETECTED (A) NOT DETECTED Final    Comment: CRITICAL CALLED AND VERIFIED WITH MATT MCBANE 11/18/2015 0335 BY RW.   Serratia marcescens NOT DETECTED NOT DETECTED Final   Haemophilus influenzae NOT DETECTED NOT DETECTED Final   Neisseria meningitidis NOT DETECTED NOT DETECTED Final   Pseudomonas aeruginosa NOT DETECTED NOT DETECTED Final   Candida albicans NOT DETECTED NOT DETECTED Final   Candida glabrata NOT DETECTED NOT DETECTED Final   Candida krusei NOT DETECTED NOT DETECTED Final   Candida parapsilosis NOT DETECTED NOT DETECTED Final   Candida tropicalis NOT DETECTED NOT DETECTED Final    Carbapenem resistance NOT DETECTED NOT DETECTED Final   Methicillin resistance NOT DETECTED NOT DETECTED Final   Vancomycin resistance NOT DETECTED NOT DETECTED Final  MRSA PCR Screening     Status: None   Collection Time: 12/01/2015  3:39 PM  Result Value Ref Range Status   MRSA by PCR NEGATIVE NEGATIVE Final    Comment:        The GeneXpert MRSA Assay (FDA approved for NASAL specimens only), is one component of a comprehensive MRSA colonization surveillance program. It is not intended to diagnose MRSA infection nor to guide or monitor treatment for MRSA infections.     Coagulation Studies: No results for input(s): LABPROT, INR in the last 72 hours.  Urinalysis:  Recent Labs  11/12/2015 0953  COLORURINE AMBER*  LABSPEC 1.016  PHURINE 6.0  GLUCOSEU NEGATIVE  HGBUR 3+*  BILIRUBINUR NEGATIVE  KETONESUR TRACE*  PROTEINUR 100*  NITRITE NEGATIVE  LEUKOCYTESUR 3+*      Imaging: Dg Chest Port 1 View  11/21/2015  CLINICAL DATA:  Shortness of breath for 1 day EXAM: PORTABLE CHEST 1 VIEW COMPARISON:  December 14, 2014 FINDINGS: There is patchy bibasilar atelectasis. Lungs elsewhere are clear. There is cardiomegaly with mild pulmonary venous hypertension. No adenopathy. Aorta is mildly tortuous. No bone lesions. IMPRESSION: Pulmonary vascular congestion without frank edema or consolidation. There is patchy bibasilar atelectasis. Electronically Signed   By: Lowella Grip III M.D.   On: 11/26/2015 10:22      Assessment & Plan: Elizabeth Farmer is a 79 y.o. white female with Alzheimer's dementia, breast cancer, COPD, hypertension. Obstructive sleep apnea, parkinson's, who was admitted to [facility] on 11/07/2015  1. Acute renal failure: on chronic kidney disease stage III: baseline creatinine of 1 with eGFR  In the 50s. With metabolic acidosis Acute renal failure due to sepsis and acute prerenal azotemia. Patient appears hypovolemic on examination.  Chronic kidney disease  secondary to hypertension.  Metabolic acidosis has worsened due to saline administration.  - nonoliguric urine output. Creatinine trending downward. No acute indication for dialysis. Renally dose all medications.  - Change Iv fluids to D5 At 34m/hr - monitor volume status, urine output and renal function   2. Hypernatremia: from free water loss. Family does not want a feeding tube.  - D5W as above.   3. Urinary tract infection: with sepsis and hypotension. Not currently on vasopressors. Hemodynamically stable.  - Empiric antibiotics: zosyn, vanco, and fluconazole.    LOS: 1[place]12/17/20169:46 AM

## 2015-11-18 NOTE — Progress Notes (Signed)
ANTIBIOTIC CONSULT NOTE - INITIAL  Pharmacy Consult for Vancomycin and Zosyn Indication: Sepsis   Allergies  Allergen Reactions  . Aspirin     Other reaction(s): Unknown  . Fish-Derived Products Other (See Comments)    Unknown reaction-MAR  . Iodine   . Salicylates   . Sulfites   . Yellow Dyes (Non-Tartrazine) Other (See Comments)    Unknown reaction-MAR    Patient Measurements: Height: 5\' 4"  (162.6 cm) Weight: 158 lb 11.7 oz (72 kg) IBW/kg (Calculated) : 54.7 Adjusted Body Weight: 59.1 kg  Vital Signs: Temp: 99 F (37.2 C) (12/16 2150) Temp Source: Oral (12/16 2150) BP: 113/73 mmHg (12/17 0300) Pulse Rate: 115 (12/17 0300) Intake/Output from previous day: 12/16 0701 - 12/17 0700 In: 1600 [I.V.:1050; IV Piggyback:550] Out: -  Intake/Output from this shift: Total I/O In: 1450 [I.V.:900; IV Piggyback:550] Out: -   Labs:  Recent Labs  11/29/2015 0953 11/12/2015 2122  WBC 40.1* 28.1*  HGB 13.5 12.5  PLT 146* 87*  CREATININE 2.35* 2.53*   Estimated Creatinine Clearance: 18.1 mL/min (by C-G formula based on Cr of 2.53). No results for input(s): VANCOTROUGH, VANCOPEAK, VANCORANDOM, GENTTROUGH, GENTPEAK, GENTRANDOM, TOBRATROUGH, TOBRAPEAK, TOBRARND, AMIKACINPEAK, AMIKACINTROU, AMIKACIN in the last 72 hours.   Microbiology: Recent Results (from the past 720 hour(s))  Blood Culture (routine x 2)     Status: None (Preliminary result)   Collection Time: 11/14/2015  9:55 AM  Result Value Ref Range Status   Specimen Description BLOOD RIGHT ARM  Final   Special Requests BOTTLES DRAWN AEROBIC AND ANAEROBIC  1CC  Final   Culture NO GROWTH < 12 HOURS  Final   Report Status PENDING  Incomplete  Blood Culture (routine x 2)     Status: None (Preliminary result)   Collection Time: 11/29/2015 10:05 AM  Result Value Ref Range Status   Specimen Description BLOOD RIGHT HAND  Final   Special Requests BOTTLES DRAWN AEROBIC AND ANAEROBIC  1CC  Final   Culture NO GROWTH < 12 HOURS  Final    Report Status PENDING  Incomplete  MRSA PCR Screening     Status: None   Collection Time: 11/04/2015  3:39 PM  Result Value Ref Range Status   MRSA by PCR NEGATIVE NEGATIVE Final    Comment:        The GeneXpert MRSA Assay (FDA approved for NASAL specimens only), is one component of a comprehensive MRSA colonization surveillance program. It is not intended to diagnose MRSA infection nor to guide or monitor treatment for MRSA infections.     Medical History: Past Medical History  Diagnosis Date  . COPD (chronic obstructive pulmonary disease) (HCC)     on nocturnal o2, Mouth breather  . CHF (congestive heart failure) ([place])   . HTN (hypertension)   . Dementia     alzherimers  . Parkinson disease ([place])   . Breast cancer [facility])     s/p mastectomy  . OSA (obstructive sleep apnea)     not on CPAP  . Chronic venous insufficiency     Medications:  Scheduled:  . antiseptic oral rinse  7 mL Mouth Rinse BID  . enoxaparin (LOVENOX) injection  30 mg Subcutaneous Q24H  . [START ON 11/24/15] fluconazole (DIFLUCAN) IV  200 mg Intravenous Q24H  . fluconazole (DIFLUCAN) IV  400 mg Intravenous Once  . letrozole  2.5 mg Oral QHS  . mometasone-formoterol  2 puff Inhalation BID  . piperacillin-tazobactam (ZOSYN)  IV  3.375 g Intravenous Q12H  .  sodium chloride  500 mL Intravenous Once  . sodium chloride  3 mL Intravenous Q12H  . tiotropium  18 mcg Inhalation Daily  . vancomycin  750 mg Intravenous Q36H  . Vitamin D3  50,000 Units Oral Q30 days   Infusions:  . sodium chloride 100 mL/hr at 11/08/2015 1553   Assessment: 79 y/o F with sepsis possibly from PNA and UTI.   Goal of Therapy:  Vancomycin trough level 15-20 mcg/ml  Plan:  Vancomycin 1250 mg iv once given. Will order vancomycin 750 mg iv q 36 hours with stacked dosing. Patient with acute renal failure so will likely need to adjust vancomycin dose as renal function improves. Will not order trough at this point.  Zosyn 3.375  g iv once given then 3.375 g EI q 12 hours.   12/17 0330 [employer] returned Proteus and C.parapsilosis. Added fluconazole for antifungal coverage per Dr. Marcille Blanco. Zosyn continued as coverage for PNA and UTI.  Will f/u renal function and culture results.    Elie Leppo S 11/18/2015,4:14 AM

## 2015-11-18 NOTE — Plan of Care (Signed)
Problem: Respiratory: Goal: Verbalizations of increased ease of respirations will increase Outcome: Progressing Pt transfered from ICU. Respond to voice by turning the head only. Morphine given x2 for air hunger with improvement. Pt turned qx2hrs. Oral care performed.  Problem: Role Relationship: Goal: Ability to verbalize concerns, feelings, and thoughts to partner or family member will improve Outcome: Progressing Educated pt's sister about comfort care and the dying process.  Problem: Pain Management: Goal: General experience of comfort will improve Outcome: Progressing No signs of pain. Morphine given for air hunger with improvement.

## 2015-11-19 LAB — URINE CULTURE

## 2015-11-20 LAB — CULTURE, BLOOD (ROUTINE X 2)

## 2015-11-25 LAB — BLOOD GAS, VENOUS
Acid-base deficit: 4 mmol/L — ABNORMAL HIGH (ref 0.0–2.0)
BICARBONATE: 19.6 meq/L — AB (ref 21.0–28.0)
PCO2 VEN: 31 mmHg — AB (ref 44.0–60.0)
PH VEN: 7.41 (ref 7.320–7.430)
Patient temperature: 37

## 2015-12-03 NOTE — Progress Notes (Signed)
Patient passed away at 7:35 NMP verified by Andre Lefort  RN  And Lanae Crumbly   RN,  Nursing supervisor, Dr Michail Sermon attending and sister Judith Part called, [place] [facility] called patient not accepted for donation (ref no [phone]), per sister Denice [place] and [place] [facility] in [place] is the funeral Home they want to pick up patient

## 2015-12-03 NOTE — Progress Notes (Signed)
Elizabeth Farmer was on comfort care and passed away this AM at 7:35AM. Pronounced by RN

## 2015-12-03 NOTE — Progress Notes (Signed)
Called to room by primary nurse Gerald Stabs, pt was found with no pulse and no audible breath sounds. Pronounced at 0735, supervisor notified.

## 2015-12-03 NOTE — Discharge Summary (Signed)
[place] at [facility]    Death Note   In breif -Elizabeth Farmer is a 80 y.o. female with a known history of breast cancer status post mastectomy, history of COPD on nocturnal home oxygen, hypertension and Alzheimer's dementia with nonverbal at baseline presents to the hospital from [facility] skilled nursing facility secondary to increased somnolence and tachypnea.  Final diagnosis:  1- Sepsis 2. Proteus Mirabilis bacteremia and UTI 3. Acute renal failure 4. Hypernatremia 5. Metabolic encephalopathy 6. COPD 7. Metabolic acidosis 8. Pneumonia 9. Severe dementia 10. H/o Breast cancer  Patient was initially admitted to ICU for tachypnea and also hypotension. Vitals stabilized with fluids. However clinically she was getting much sicker. Remained oliguric with worsening kidney function. Lethargic with tachypnea. She was a DO NOT RESUSCITATE Sister and niece were at bedside, sister is the healthcare power of attorney. After extensive discussion with the family, they have agreed that this looks like terminal illness at this time and patient might not recover from this in a meaningful condition. Offered comfort care measures. Patient was placed on comfort care. She passed away on 12/01/2015 at 7:35 AM    Elizabeth Farmer [id number] is a 80 y.o. female, Outpatient Primary MD for the patient is No primary care provider on file.  Pronounced dead by Hurman Horn, RN  on  12/01/15    @       7:35AM              Gladstone Lighter M.D on 12-01-15 at 10:11 AM  [place] at [place] [phone]  Total clinical and documentation time for today Under 30 minutes

## 2015-12-03 NOTE — Plan of Care (Signed)
Problem: Respiratory: Goal: Verbalizations of increased ease of respirations will increase Outcome: Not Progressing Pt's respiratory rate is labored. Morphine given with little improvement for air hunger. Mouth care given with lip moistourizer and mouth swabs. Therapeutic presence offered to patient, hourly rounding. Pt responds to voice and touch at times. Foley remains in place for end of life care.

## 2015-12-03 DEATH — deceased

## 2016-05-28 IMAGING — US US RENAL
1 series · 14 of 25 positions shown · non-contrast
Comparison: Chest CT 08/14/2015.

CLINICAL DATA: Patient acute renal failure.

EXAM:
RENAL / URINARY TRACT ULTRASOUND COMPLETE

[Series 1: us renal · 0.31mm/px · 14 of 34 slices shown]
[im 1/34]
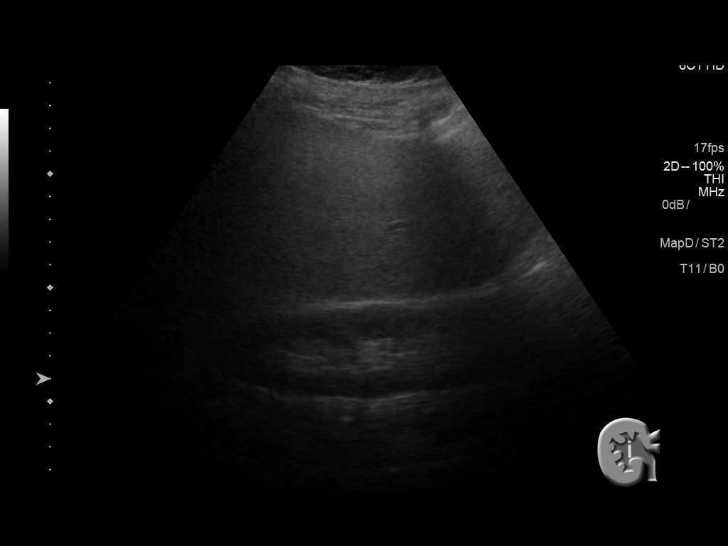
[im 3/34]
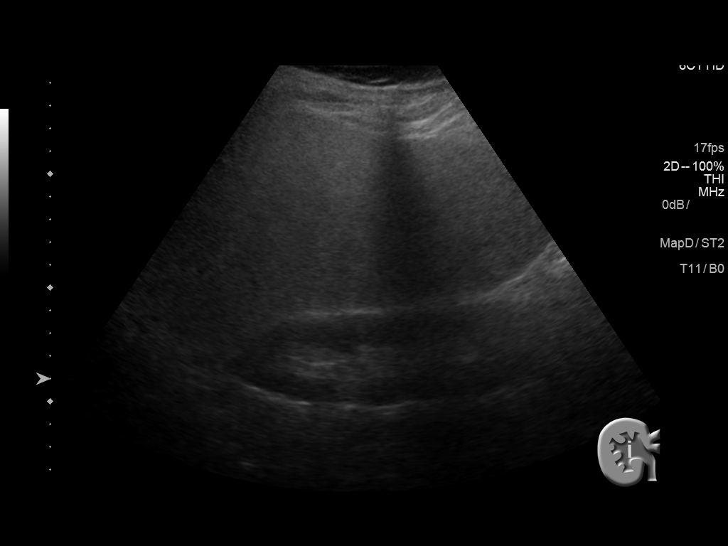
[im 6/34]
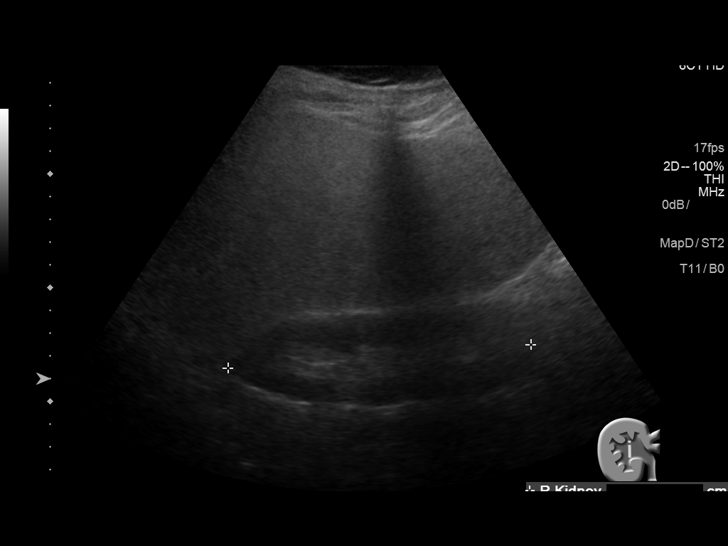
[im 9/34]
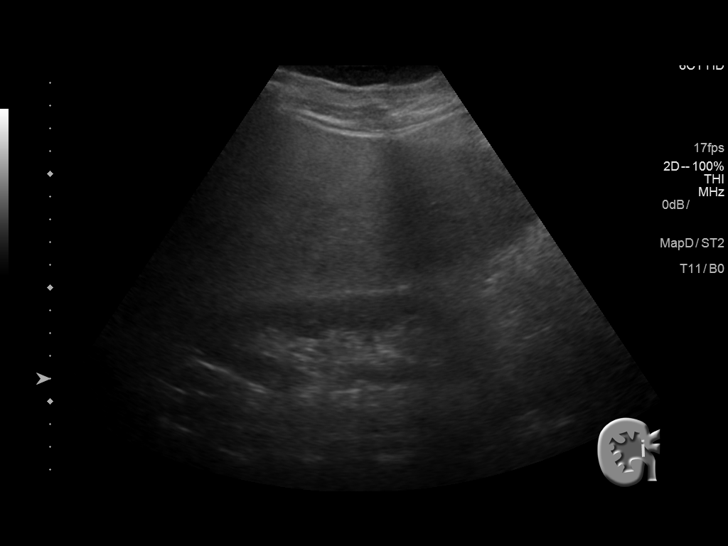
[im 12/34]
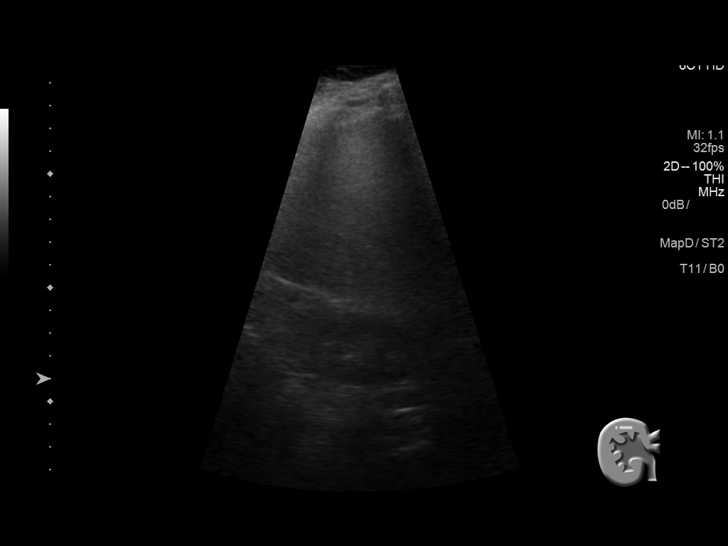
[im 13/34]
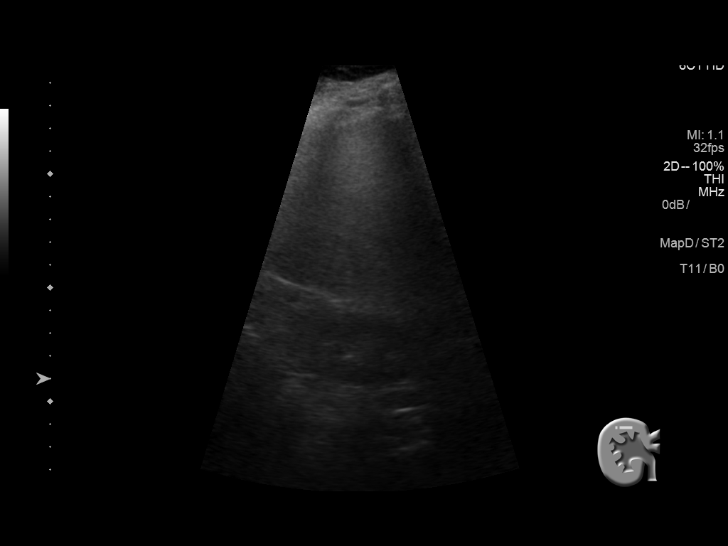
[im 16/34]
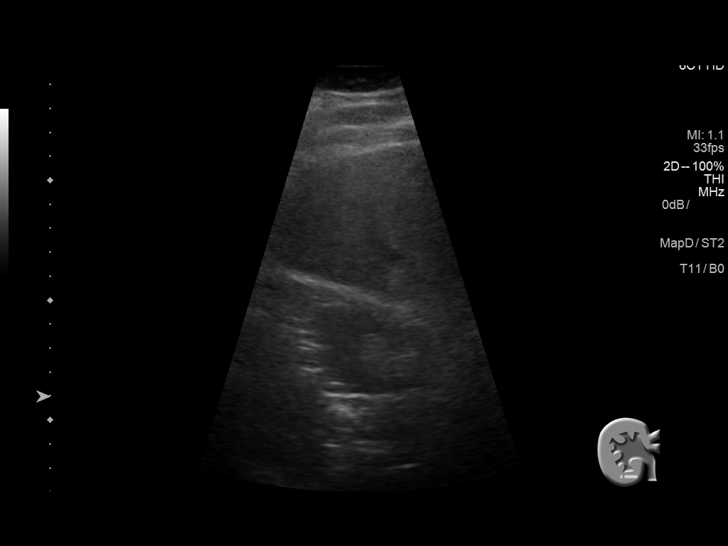
[im 18/34]
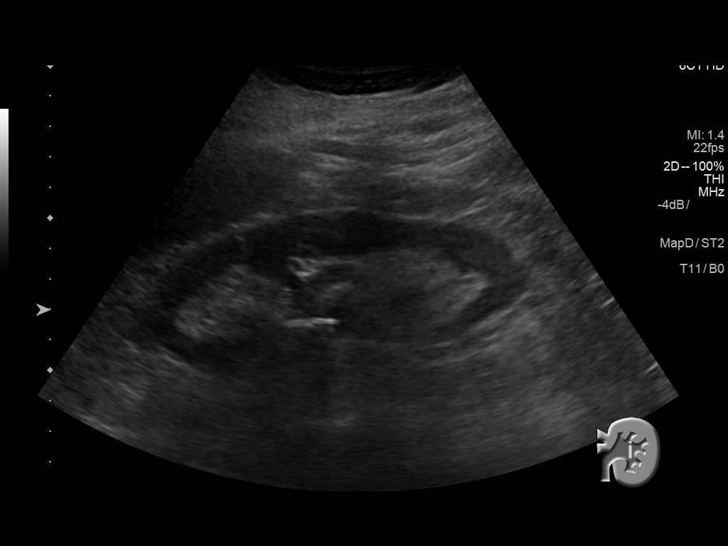
[im 21/34]
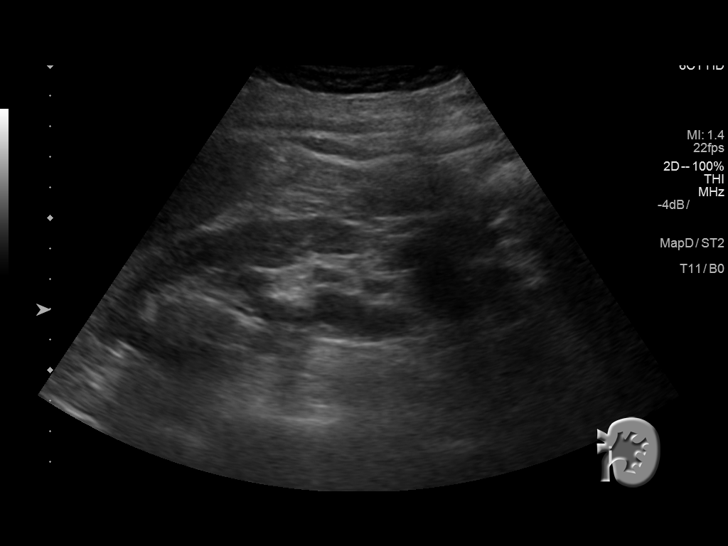
[im 23/34]
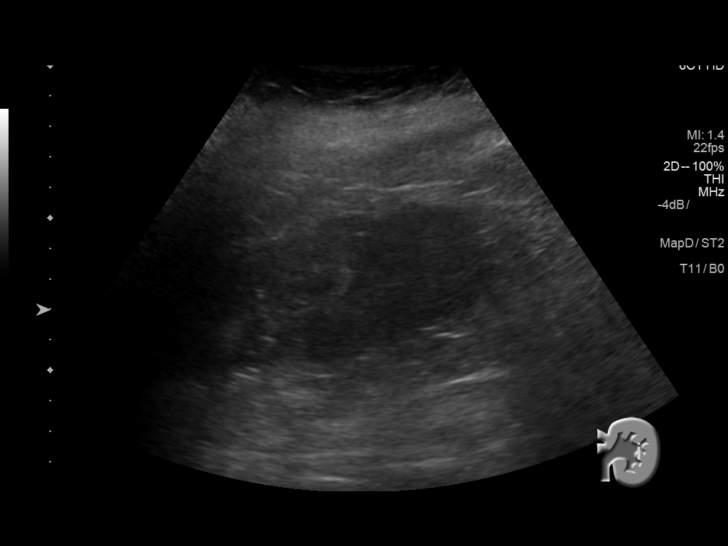
[im 25/34]
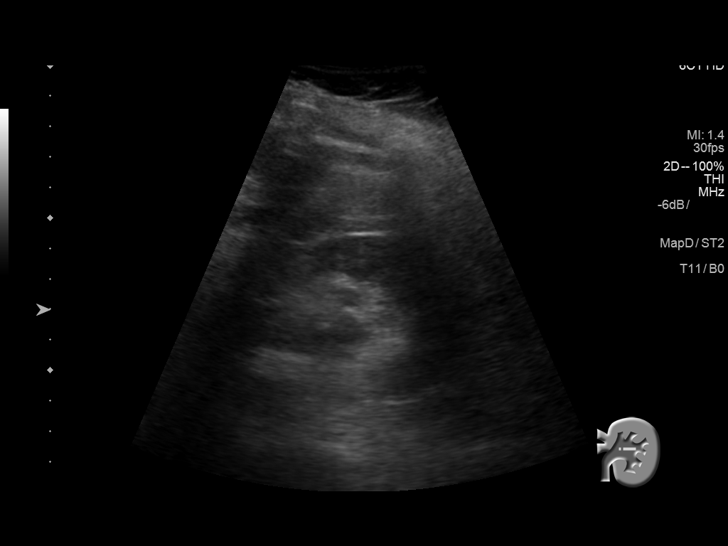
[im 28/34]
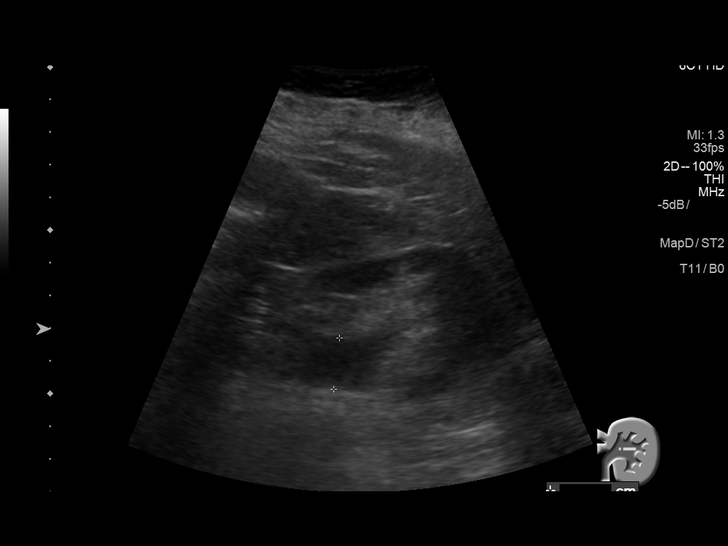
[im 31/34]
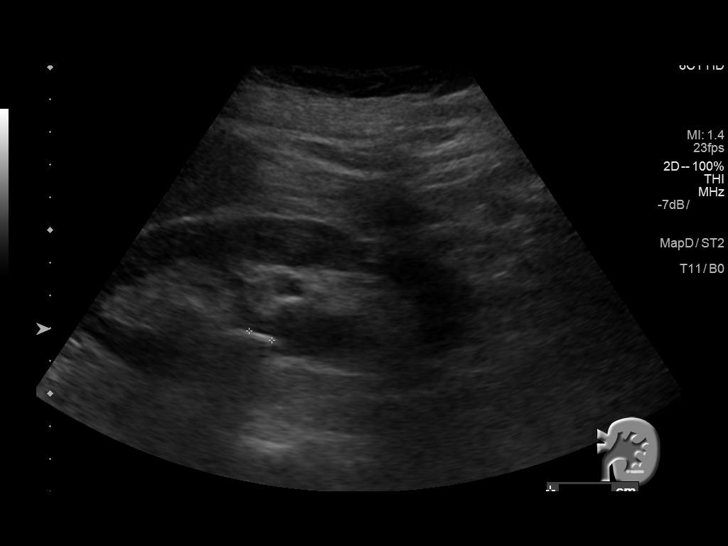
[im 34/34]
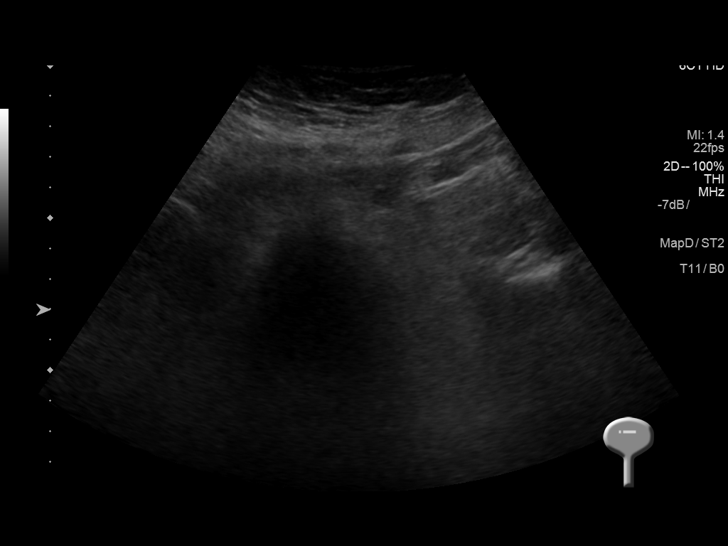

[14 of 25 positions shown; findings below may reference images not displayed]

FINDINGS: Right Kidney:

Length: 12.9 cm. Echogenicity within normal limits. No mass or
hydronephrosis visualized.

Left Kidney:

Length: 13.4 cm. Mild hydronephrosis. There is a 7 mm echogenic
focus within the renal collecting system.

Bladder:

Decompressed with Foley catheter.

Incidental note is made of hepatic steatosis.
IMPRESSION: There is mild left hydronephrosis. Echogenic focus within the left
renal collecting system favored to represent a small stone.
